# Patient Record
Sex: Female | Born: 1980 | Race: White | Hispanic: No | Marital: Married | Smoking: Never smoker
Health system: Southern US, Community
[De-identification: ages and names within clinical notes are randomized; demographics above are authoritative.]

## PROBLEM LIST (undated history)

## (undated) DIAGNOSIS — E282 Polycystic ovarian syndrome: Secondary | ICD-10-CM

## (undated) DIAGNOSIS — E079 Disorder of thyroid, unspecified: Secondary | ICD-10-CM

## (undated) DIAGNOSIS — E559 Vitamin D deficiency, unspecified: Secondary | ICD-10-CM

## (undated) DIAGNOSIS — F32A Depression, unspecified: Secondary | ICD-10-CM

## (undated) DIAGNOSIS — K219 Gastro-esophageal reflux disease without esophagitis: Secondary | ICD-10-CM

## (undated) DIAGNOSIS — D649 Anemia, unspecified: Secondary | ICD-10-CM

## (undated) HISTORY — PX: WISDOM TOOTH EXTRACTION: SHX21

---

## 2006-12-09 ENCOUNTER — Ambulatory Visit: Payer: Self-pay | Admitting: Internal Medicine

## 2006-12-23 ENCOUNTER — Ambulatory Visit: Payer: Self-pay | Admitting: Internal Medicine

## 2006-12-27 ENCOUNTER — Ambulatory Visit: Payer: Self-pay | Admitting: Internal Medicine

## 2007-01-27 ENCOUNTER — Ambulatory Visit: Payer: Self-pay | Admitting: Internal Medicine

## 2007-02-24 ENCOUNTER — Ambulatory Visit: Payer: Self-pay | Admitting: Internal Medicine

## 2007-05-05 ENCOUNTER — Ambulatory Visit: Payer: Self-pay | Admitting: Internal Medicine

## 2007-08-04 ENCOUNTER — Ambulatory Visit: Payer: Self-pay | Admitting: Internal Medicine

## 2007-09-01 ENCOUNTER — Ambulatory Visit: Payer: Self-pay | Admitting: Gastroenterology

## 2013-06-23 DIAGNOSIS — E039 Hypothyroidism, unspecified: Secondary | ICD-10-CM

## 2013-06-23 DIAGNOSIS — R5381 Other malaise: Secondary | ICD-10-CM | POA: Insufficient documentation

## 2013-06-23 DIAGNOSIS — E785 Hyperlipidemia, unspecified: Secondary | ICD-10-CM | POA: Insufficient documentation

## 2013-06-25 DIAGNOSIS — F329 Major depressive disorder, single episode, unspecified: Secondary | ICD-10-CM | POA: Insufficient documentation

## 2013-06-25 DIAGNOSIS — R7309 Other abnormal glucose: Secondary | ICD-10-CM | POA: Insufficient documentation

## 2014-04-17 DIAGNOSIS — R7989 Other specified abnormal findings of blood chemistry: Secondary | ICD-10-CM | POA: Insufficient documentation

## 2014-05-07 DIAGNOSIS — D509 Iron deficiency anemia, unspecified: Secondary | ICD-10-CM | POA: Insufficient documentation

## 2016-11-17 DIAGNOSIS — N3281 Overactive bladder: Secondary | ICD-10-CM | POA: Insufficient documentation

## 2017-08-30 DIAGNOSIS — H9201 Otalgia, right ear: Secondary | ICD-10-CM | POA: Insufficient documentation

## 2017-08-30 DIAGNOSIS — K219 Gastro-esophageal reflux disease without esophagitis: Secondary | ICD-10-CM

## 2017-08-30 DIAGNOSIS — R491 Aphonia: Secondary | ICD-10-CM | POA: Insufficient documentation

## 2018-05-26 DIAGNOSIS — R0683 Snoring: Secondary | ICD-10-CM | POA: Insufficient documentation

## 2020-03-28 ENCOUNTER — Other Ambulatory Visit: Payer: Self-pay

## 2020-03-28 ENCOUNTER — Encounter (HOSPITAL_BASED_OUTPATIENT_CLINIC_OR_DEPARTMENT_OTHER): Payer: Self-pay

## 2020-03-28 ENCOUNTER — Observation Stay (HOSPITAL_BASED_OUTPATIENT_CLINIC_OR_DEPARTMENT_OTHER)
Admission: EM | Admit: 2020-03-28 | Discharge: 2020-03-31 | Disposition: A | Discharge status: Home / Self Care | Payer: 59 | Attending: Internal Medicine | Admitting: Internal Medicine

## 2020-03-28 ENCOUNTER — Emergency Department (HOSPITAL_BASED_OUTPATIENT_CLINIC_OR_DEPARTMENT_OTHER): Payer: 59

## 2020-03-28 DIAGNOSIS — R7401 Elevation of levels of liver transaminase levels: Secondary | ICD-10-CM | POA: Insufficient documentation

## 2020-03-28 DIAGNOSIS — Z79899 Other long term (current) drug therapy: Secondary | ICD-10-CM | POA: Diagnosis not present

## 2020-03-28 DIAGNOSIS — E039 Hypothyroidism, unspecified: Secondary | ICD-10-CM

## 2020-03-28 DIAGNOSIS — K7581 Nonalcoholic steatohepatitis (NASH): Secondary | ICD-10-CM

## 2020-03-28 DIAGNOSIS — K219 Gastro-esophageal reflux disease without esophagitis: Secondary | ICD-10-CM

## 2020-03-28 DIAGNOSIS — Z20822 Contact with and (suspected) exposure to covid-19: Secondary | ICD-10-CM | POA: Diagnosis not present

## 2020-03-28 DIAGNOSIS — K8071 Calculus of gallbladder and bile duct without cholecystitis with obstruction: Secondary | ICD-10-CM | POA: Diagnosis not present

## 2020-03-28 DIAGNOSIS — F32A Depression, unspecified: Secondary | ICD-10-CM

## 2020-03-28 DIAGNOSIS — R1031 Right lower quadrant pain: Secondary | ICD-10-CM | POA: Diagnosis present

## 2020-03-28 DIAGNOSIS — K8000 Calculus of gallbladder with acute cholecystitis without obstruction: Secondary | ICD-10-CM

## 2020-03-28 DIAGNOSIS — K8020 Calculus of gallbladder without cholecystitis without obstruction: Secondary | ICD-10-CM

## 2020-03-28 DIAGNOSIS — R933 Abnormal findings on diagnostic imaging of other parts of digestive tract: Secondary | ICD-10-CM

## 2020-03-28 DIAGNOSIS — K831 Obstruction of bile duct: Secondary | ICD-10-CM | POA: Diagnosis present

## 2020-03-28 DIAGNOSIS — R7989 Other specified abnormal findings of blood chemistry: Secondary | ICD-10-CM

## 2020-03-28 DIAGNOSIS — R1011 Right upper quadrant pain: Secondary | ICD-10-CM

## 2020-03-28 HISTORY — DX: Depression, unspecified: F32.A

## 2020-03-28 HISTORY — DX: Vitamin D deficiency, unspecified: E55.9

## 2020-03-28 HISTORY — DX: Anemia, unspecified: D64.9

## 2020-03-28 HISTORY — DX: Disorder of thyroid, unspecified: E07.9

## 2020-03-28 HISTORY — DX: Polycystic ovarian syndrome: E28.2

## 2020-03-28 HISTORY — DX: Gastro-esophageal reflux disease without esophagitis: K21.9

## 2020-03-28 LAB — COMPREHENSIVE METABOLIC PANEL
ALT: 149 U/L — ABNORMAL HIGH (ref 0–44)
AST: 176 U/L — ABNORMAL HIGH (ref 15–41)
Albumin: 3.8 g/dL (ref 3.5–5.0)
Alkaline Phosphatase: 175 U/L — ABNORMAL HIGH (ref 38–126)
Anion gap: 10 (ref 5–15)
BUN: 8 mg/dL (ref 6–20)
CO2: 23 mmol/L (ref 22–32)
Calcium: 8.6 mg/dL — ABNORMAL LOW (ref 8.9–10.3)
Chloride: 103 mmol/L (ref 98–111)
Creatinine, Ser: 0.68 mg/dL (ref 0.44–1.00)
GFR, Estimated: >60 mL/min (ref >60)
Glucose, Bld: 97 mg/dL (ref 70–99)
Potassium: 3.7 mmol/L (ref 3.5–5.1)
Sodium: 136 mmol/L (ref 135–145)
Total Bilirubin: 3 mg/dL — ABNORMAL HIGH (ref 0.3–1.2)
Total Protein: 7.2 g/dL (ref 6.5–8.1)

## 2020-03-28 LAB — URINALYSIS, ROUTINE W REFLEX MICROSCOPIC
Glucose, UA: NEGATIVE mg/dL
Hgb urine dipstick: NEGATIVE
Ketones, ur: NEGATIVE mg/dL
Leukocytes,Ua: NEGATIVE
Nitrite: NEGATIVE
Protein, ur: NEGATIVE mg/dL
Specific Gravity, Urine: 1.01 (ref 1.005–1.030)
pH: 7.5 (ref 5.0–8.0)

## 2020-03-28 LAB — LIPASE, BLOOD: Lipase: 23 U/L (ref 11–51)

## 2020-03-28 LAB — CBC
HCT: 41.7 % (ref 36.0–46.0)
Hemoglobin: 13.5 g/dL (ref 12.0–15.0)
MCH: 25.7 pg — ABNORMAL LOW (ref 26.0–34.0)
MCHC: 32.4 g/dL (ref 30.0–36.0)
MCV: 79.4 fL — ABNORMAL LOW (ref 80.0–100.0)
Platelets: 205 10*3/uL (ref 150–400)
RBC: 5.25 MIL/uL — ABNORMAL HIGH (ref 3.87–5.11)
RDW: 14 % (ref 11.5–15.5)
WBC: 7.5 10*3/uL (ref 4.0–10.5)
nRBC: 0 % (ref 0.0–0.2)

## 2020-03-28 LAB — RESP PANEL BY RT-PCR (FLU A&B, COVID) ARPGX2
Influenza A by PCR: NEGATIVE
Influenza B by PCR: NEGATIVE
SARS Coronavirus 2 by RT PCR: NEGATIVE

## 2020-03-28 LAB — PREGNANCY, URINE: Preg Test, Ur: NEGATIVE

## 2020-03-28 MED ORDER — MORPHINE SULFATE (PF) 4 MG/ML IV SOLN
4.0000 mg | Freq: Once | INTRAVENOUS | Status: AC
  Administered 2020-03-28: 4 mg via INTRAVENOUS
  Filled 2020-03-28: qty 1

## 2020-03-28 MED ORDER — ONDANSETRON HCL 4 MG/2ML IJ SOLN
4.0000 mg | Freq: Once | INTRAMUSCULAR | Status: AC
  Administered 2020-03-28: 4 mg via INTRAVENOUS
  Filled 2020-03-28: qty 2

## 2020-03-28 NOTE — Plan of Care (Signed)
Patient accepted for biliary obstruction. ED spoke with GI who requested MRCP. They plan on seeing her in the morning.

## 2020-03-28 NOTE — ED Triage Notes (Addendum)
Pt c/o intermittent right side abd pain x 2 days-states she was sent from UC for gallbladder evaluation-NAD-steady gait

## 2020-03-28 NOTE — ED Provider Notes (Signed)
[facility] EMERGENCY DEPARTMENT Provider Note   CSN: [id number] Arrival date & time: 03/28/20  1507     History Chief Complaint  Patient presents with  . Abdominal Pain    Brittany Esparza is a 39 y.o. female presented for evaluation of nausea and abdominal pain.  Patient states for the past several days, she has had right upper quadrant abdominal pain with associated nausea.  Symptoms worsened yesterday.  She denies associated fevers.  No vomiting.  Pain is constant, nothing makes it better or worse.  She denies cough, chest pain, shortness of breath, abnormal bowel movements.  Patient states today she felt her urine was darker than normal, prompting her visit to the urgent care due to concerns for UTI.  She had a normal lab work, sent to the ER for further evaluation.  She denies previous history of abdominal problems or surgeries. Never seen a GI doc before.   Additional history obtained from chart review.  Patient with a history of anemia, depression, GERD, PCOS, hypothyroidism.  HPI     Past Medical History:  Diagnosis Date  . Anemia   . Depression   . GERD (gastroesophageal reflux disease)   . PCOS (polycystic ovarian syndrome)   . Thyroid disease   . Vitamin D deficiency     Patient Active Problem List   Diagnosis Date Noted  . Biliary obstruction 03/28/2020    Past Surgical History:  Procedure Laterality Date  . CESAREAN SECTION    . WISDOM TOOTH EXTRACTION       OB History   No obstetric history on file.     No family history on file.  Social History   Tobacco Use  . Smoking status: Never Smoker  . Smokeless tobacco: Never Used  Substance Use Topics  . Alcohol use: Yes    Comment: rare  . Drug use: Never    Home Medications Prior to Admission medications   Medication Sig Start Date End Date Taking? Authorizing Provider  levothyroxine (SYNTHROID) 112 MCG tablet Take 224 mcg by mouth daily before breakfast.    [provider]  pantoprazole (PROTONIX) 40 MG tablet Take 40 mg by mouth daily.    [provider]  sertraline (ZOLOFT) 50 MG tablet Take 150 mg by mouth daily.    [provider]  Vitamin D, Ergocalciferol, (DRISDOL) 1.25 MG (50000 UNIT) CAPS capsule Take 50,000 Units by mouth every 7 (seven) days.    [provider]    Allergies    Penicillins  Review of Systems   Review of Systems  Gastrointestinal: Positive for abdominal pain and nausea.  All other systems reviewed and are negative.   Physical Exam Updated Vital Signs BP 119/69 (BP Location: Left Arm)   Pulse 79   Temp 98.2 F (36.8 C) (Oral)   Resp 20   Ht 5\' 5"  (1.651 m)   Wt (!) 159.7 kg   LMP 03/04/2020   SpO2 98%   BMI 58.58 kg/m   Physical Exam Vitals and nursing note reviewed.  Constitutional:      General: She is not in acute distress.    Appearance: She is well-developed and well-nourished. She is obese.     Comments: Resting in the bed in no acute distress  HENT:     Head: Normocephalic and atraumatic.  Eyes:     Extraocular Movements: EOM normal.     Conjunctiva/sclera: Conjunctivae normal.     Pupils: Pupils are equal, round, and reactive  to light.  Cardiovascular:     Rate and Rhythm: Normal rate and regular rhythm.     Pulses: Normal pulses and intact distal pulses.  Pulmonary:     Effort: Pulmonary effort is normal. No respiratory distress.     Breath sounds: Normal breath sounds. No wheezing.  Abdominal:     General: Bowel sounds are normal. There is no distension.     Palpations: Abdomen is soft.     Tenderness: There is abdominal tenderness. There is no guarding or rebound.     Comments: Tenderness palpation of right upper quadrant abdomen.  No rigidity, guarding, distention.  Negative rebound.  Exam is limited due to body habitus.  Musculoskeletal:        General: Normal range of motion.     Cervical back: Normal range of motion and neck supple.  Skin:     General: Skin is warm and dry.     Capillary Refill: Capillary refill takes less than 2 seconds.  Neurological:     Mental Status: She is alert and oriented to person, place, and time.  Psychiatric:        Mood and Affect: Mood and affect normal.     ED Results / Procedures / Treatments   Labs (all labs ordered are listed, but only abnormal results are displayed) Labs Reviewed  COMPREHENSIVE METABOLIC PANEL - Abnormal; Notable for the following components:      Result Value   Calcium 8.6 (*)    AST 176 (*)    ALT 149 (*)    Alkaline Phosphatase 175 (*)    Total Bilirubin 3.0 (*)    All other components within normal limits  CBC - Abnormal; Notable for the following components:   RBC 5.25 (*)    MCV 79.4 (*)    MCH 25.7 (*)    All other components within normal limits  URINALYSIS, ROUTINE W REFLEX MICROSCOPIC - Abnormal; Notable for the following components:   APPearance HAZY (*)    Bilirubin Urine SMALL (*)    All other components within normal limits  RESP PANEL BY RT-PCR (FLU A&B, COVID) ARPGX2  LIPASE, BLOOD  PREGNANCY, URINE    EKG None  Radiology US Abdomen Limited RUQ (LIVER/GB)  Result Date: 03/28/2020 CLINICAL DATA:  Right upper quadrant pain. EXAM: ULTRASOUND ABDOMEN LIMITED RIGHT UPPER QUADRANT COMPARISON:  None. FINDINGS: Gallbladder: Partially distended. Multiple intraluminal gallstones. No gallbladder wall thickening or pericholecystic fluid. No sonographic Murphy sign noted by sonographer. Common bile duct: Diameter: 9 mm, dilated.  No visualized choledocholithiasis. Liver: Heterogeneously increased hepatic echogenicity. The liver parenchyma is difficult to penetrate. No evidence of focal lesion on this limited exam. Portal vein is patent on color Doppler imaging with normal direction of blood flow towards the liver. Other: No right upper quadrant ascites. IMPRESSION: 1. Gallstones without sonographic findings of acute cholecystitis. 2. Dilated common bile duct  9 mm. No visualized choledocholithiasis. Consider further evaluation with MRCP. MRCP should only be considered if patient is able to tolerate breath hold technique. 3. Hepatic steatosis. Electronically Signed   By: Narda Rutherford M.D.   On: 03/28/2020 16:10    Procedures Procedures (including critical care time)  Medications Ordered in ED Medications  ondansetron (ZOFRAN) injection 4 mg (4 mg Intravenous Given 03/28/20 1742)  morphine 4 MG/ML injection 4 mg (4 mg Intravenous Given 03/28/20 1744)    ED Course  I have reviewed the triage vital signs and the nursing notes.  Pertinent labs & imaging results  that were available during my care of the patient were reviewed by me and considered in my medical decision making (see chart for details).    MDM Rules/Calculators/A&P                          Patient presented for evaluation of right upper quadrant abdominal pain and nausea.  On exam, patient appears nontoxic.  She does have tenderness of the right upper quadrant.  Labs obtained from triage interpreted by me, shows elevated LFTs and bili.  No leukocytosis.  Concern for gallbladder/liver etiology.  Ultrasound pending.  Ultrasound shows gallstones without signs of cholecystitis.  However there is concern for possible stone in the duct.  As patient does have elevated LFTs and bili with continued pain, clinically she may have stones in the duct.  Will need to consult with GI, she will likely need to be admitted for MRCP.  Discussed with Dr. Dulce Sellar from GI who agrees on admission to medicine for MRCP. Will see pt in consult after imaging.   Discussed with Dr. Piedad Climes from [facility], pt to be admitted.   Final Clinical Impression(s) / ED Diagnoses Final diagnoses:  RUQ abdominal pain  Calculus of gallbladder and bile duct with obstruction without cholecystitis  Elevated LFTs    Rx / DC Orders ED Discharge Orders    None       Alveria Apley, PA-C 03/28/20  1841    Milagros Loll, MD 03/30/20 1219

## 2020-03-29 ENCOUNTER — Observation Stay (HOSPITAL_COMMUNITY): Payer: 59

## 2020-03-29 DIAGNOSIS — K831 Obstruction of bile duct: Secondary | ICD-10-CM

## 2020-03-29 DIAGNOSIS — F32A Depression, unspecified: Secondary | ICD-10-CM

## 2020-03-29 LAB — COMPREHENSIVE METABOLIC PANEL
ALT: 116 U/L — ABNORMAL HIGH (ref 0–44)
AST: 107 U/L — ABNORMAL HIGH (ref 15–41)
Albumin: 3.3 g/dL — ABNORMAL LOW (ref 3.5–5.0)
Alkaline Phosphatase: 152 U/L — ABNORMAL HIGH (ref 38–126)
Anion gap: 10 (ref 5–15)
BUN: 8 mg/dL (ref 6–20)
CO2: 25 mmol/L (ref 22–32)
Calcium: 8.4 mg/dL — ABNORMAL LOW (ref 8.9–10.3)
Chloride: 103 mmol/L (ref 98–111)
Creatinine, Ser: 0.77 mg/dL (ref 0.44–1.00)
GFR, Estimated: >60 mL/min (ref >60)
Glucose, Bld: 103 mg/dL — ABNORMAL HIGH (ref 70–99)
Potassium: 3.6 mmol/L (ref 3.5–5.1)
Sodium: 138 mmol/L (ref 135–145)
Total Bilirubin: 1.4 mg/dL — ABNORMAL HIGH (ref 0.3–1.2)
Total Protein: 6.3 g/dL — ABNORMAL LOW (ref 6.5–8.1)

## 2020-03-29 LAB — HIV ANTIBODY (ROUTINE TESTING W REFLEX): HIV Screen 4th Generation wRfx: NONREACTIVE

## 2020-03-29 MED ORDER — LEVOTHYROXINE SODIUM 112 MCG PO TABS
224.0000 ug | ORAL_TABLET | Freq: Every day | ORAL | Status: DC
  Administered 2020-03-29 – 2020-03-31 (×3): 224 ug via ORAL
  Filled 2020-03-29 (×3): qty 2

## 2020-03-29 MED ORDER — BISACODYL 5 MG PO TBEC: 5.0000 mg | DELAYED_RELEASE_TABLET | Freq: Every day | ORAL | Status: DC | PRN

## 2020-03-29 MED ORDER — PANTOPRAZOLE SODIUM 40 MG PO TBEC
40.0000 mg | DELAYED_RELEASE_TABLET | Freq: Every day | ORAL | Status: DC
  Administered 2020-03-29 – 2020-03-31 (×3): 40 mg via ORAL
  Filled 2020-03-29 (×3): qty 1

## 2020-03-29 MED ORDER — GADOBUTROL 1 MMOL/ML IV SOLN
10.0000 mL | Freq: Once | INTRAVENOUS | Status: AC | PRN
  Administered 2020-03-29: 10 mL via INTRAVENOUS

## 2020-03-29 MED ORDER — ENOXAPARIN SODIUM 80 MG/0.8ML ~~LOC~~ SOLN
80.0000 mg | SUBCUTANEOUS | Status: DC
  Administered 2020-03-29: 80 mg via SUBCUTANEOUS
  Filled 2020-03-29: qty 0.8

## 2020-03-29 MED ORDER — KETOROLAC TROMETHAMINE 30 MG/ML IJ SOLN
30.0000 mg | Freq: Once | INTRAMUSCULAR | Status: AC
  Administered 2020-03-29: 30 mg via INTRAVENOUS
  Filled 2020-03-29: qty 1

## 2020-03-29 MED ORDER — OXYCODONE HCL 5 MG PO TABS
5.0000 mg | ORAL_TABLET | ORAL | Status: DC | PRN
  Administered 2020-03-30 – 2020-03-31 (×5): 5 mg via ORAL
  Filled 2020-03-29 (×6): qty 1

## 2020-03-29 MED ORDER — ADULT MULTIVITAMIN W/MINERALS CH
1.0000 | ORAL_TABLET | Freq: Every day | ORAL | Status: DC
  Administered 2020-03-29 – 2020-03-31 (×2): 1 via ORAL
  Filled 2020-03-29 (×2): qty 1

## 2020-03-29 MED ORDER — ENOXAPARIN SODIUM 40 MG/0.4ML ~~LOC~~ SOLN: 40.0000 mg | SUBCUTANEOUS | Status: DC

## 2020-03-29 MED ORDER — SERTRALINE HCL 50 MG PO TABS
150.0000 mg | ORAL_TABLET | Freq: Every day | ORAL | Status: DC
  Administered 2020-03-29 – 2020-03-31 (×3): 150 mg via ORAL
  Filled 2020-03-29 (×3): qty 1

## 2020-03-29 MED ORDER — ONDANSETRON HCL 4 MG/2ML IJ SOLN
4.0000 mg | Freq: Once | INTRAMUSCULAR | Status: AC
  Administered 2020-03-29: 4 mg via INTRAVENOUS
  Filled 2020-03-29: qty 2

## 2020-03-29 MED ORDER — SENNOSIDES-DOCUSATE SODIUM 8.6-50 MG PO TABS: 1.0000 | ORAL_TABLET | Freq: Every evening | ORAL | Status: DC | PRN

## 2020-03-29 NOTE — H&P (Signed)
History and Physical    Brittany Esparza UMP:[id number] DOB: 1980/05/29 DOA: 03/28/2020  Referring MD/NP/PA:  PCP: Beryle Quant, MD  Outpatient Specialists:  Patient coming from: Home  Chief Complaint: abdominal pain  HPI: Brittany Esparza is a 39 y.o. female with medical history significant of obesity, hypothyroidism, GERD, depression who presents with biliary obstruction as a transfer from urgent care.  Patient reports having abdominal pain this past Saturday that was evanescent. She had a recurrence of Wednesday that was not associated with food but described as an epigastric abdominal pain that radiated to her back and was associated with nausea and bloating.  She had noted that her urine turned dark at that point she decided to be evaluated in urgent care.  She denies fevers, chills, vomiting, chest pain, shortness of breath, headache, change in vision,diarrhea.   In the urgent care she was noted to have a normal WBC with a CMP that demonstrated alk phos 175 AST ALT 176, 149 respectively and elevated T bilirubin of 3.0.  UA showed small bilirubin.  Ultrasound was obtained which showed gallstones without sonographic findings of acute cholecystitis. Dilated common bile duct 9 mm and hepatic steatosis.  GI was consulted with plans for MRCP in the morning. She was given morphine and she was transferred here for further evaluation management.  Review of Systems: As per HPI otherwise 10 point review of systems negative.   Past Medical History:  Diagnosis Date  . Anemia   . Depression   . GERD (gastroesophageal reflux disease)   . PCOS (polycystic ovarian syndrome)   . Thyroid disease   . Vitamin D deficiency     Past Surgical History:  Procedure Laterality Date  . CESAREAN SECTION    . WISDOM TOOTH EXTRACTION       reports that she has never smoked. She has never used smokeless tobacco. She reports current alcohol use. She reports that she does not use  drugs.  Allergies  Allergen Reactions  . Penicillins Dermatitis    No family history on file. Unacceptable: Noncontributory, unremarkable, or negative. Acceptable: Family history reviewed and not pertinent (If you reviewed it)  Prior to Admission medications   Medication Sig Start Date End Date Taking? Authorizing Provider  levothyroxine (SYNTHROID) 112 MCG tablet Take 224 mcg by mouth daily before breakfast.   Yes [provider]  pantoprazole (PROTONIX) 40 MG tablet Take 40 mg by mouth daily.   Yes [provider]  sertraline (ZOLOFT) 50 MG tablet Take 150 mg by mouth daily.   Yes [provider]    Physical Exam: Vitals:   03/28/20 2130 03/28/20 2134 03/28/20 2220 03/29/20 0153  BP: 105/63 105/63 (!) 103/58 (!) 91/53  Pulse: 99 99 71 79  Resp: '18 18 18 18  ' Temp:  98.2 F (36.8 C) 98.5 F (36.9 C) 97.8 F (36.6 C)  TempSrc:   Oral Oral  SpO2: 100% 100% 98% 96%  Weight:      Height:          Constitutional: NAD, calm, comfortable Vitals:   03/28/20 2130 03/28/20 2134 03/28/20 2220 03/29/20 0153  BP: 105/63 105/63 (!) 103/58 (!) 91/53  Pulse: 99 99 71 79  Resp: '18 18 18 18  ' Temp:  98.2 F (36.8 C) 98.5 F (36.9 C) 97.8 F (36.6 C)  TempSrc:   Oral Oral  SpO2: 100% 100% 98% 96%  Weight:      Height:       Eyes: PERRL, lids  and conjunctivae normal ENMT: Mucous membranes are moist.  Neck: normal, supple Respiratory: clear to auscultation bilaterally, no wheezing, no crackles. Normal respiratory effort. No accessory muscle use.  Cardiovascular: Regular rate and rhythm, no murmurs / rubs / gallops. No extremity edema. 2+ pedal pulses. No carotid bruits.  Abdomen: no tenderness, no masses palpated. Bowel sounds positive.  Skin: no rashes, lesions, ulcers. No induration Neurologic: CN 2-12 grossly intact. Moving all extremities  Psychiatric: Normal judgment and insight. Alert and oriented x 3. Normal mood.   Labs on Admission: I have  personally reviewed following labs and imaging studies  CBC: Recent Labs  Lab 03/28/20 1532  WBC 7.5  HGB 13.5  HCT 41.7  MCV 79.4*  PLT 712   Basic Metabolic Panel: Recent Labs  Lab 03/28/20 1532  NA 136  K 3.7  CL 103  CO2 23  GLUCOSE 97  BUN 8  CREATININE 0.68  CALCIUM 8.6*   GFR: Estimated Creatinine Clearance: 149.1 mL/min (by C-G formula based on SCr of 0.68 mg/dL). Liver Function Tests: Recent Labs  Lab 03/28/20 1532  AST 176*  ALT 149*  ALKPHOS 175*  BILITOT 3.0*  PROT 7.2  ALBUMIN 3.8   Recent Labs  Lab 03/28/20 1532  LIPASE 23   No results for input(s): AMMONIA in the last 168 hours. Coagulation Profile: No results for input(s): INR, PROTIME in the last 168 hours. Cardiac Enzymes: No results for input(s): CKTOTAL, CKMB, CKMBINDEX, TROPONINI in the last 168 hours. BNP (last 3 results) No results for input(s): PROBNP in the last 8760 hours. HbA1C: No results for input(s): HGBA1C in the last 72 hours. CBG: No results for input(s): GLUCAP in the last 168 hours. Lipid Profile: No results for input(s): CHOL, HDL, LDLCALC, TRIG, CHOLHDL, LDLDIRECT in the last 72 hours. Thyroid Function Tests: No results for input(s): TSH, T4TOTAL, FREET4, T3FREE, THYROIDAB in the last 72 hours. Anemia Panel: No results for input(s): VITAMINB12, FOLATE, FERRITIN, TIBC, IRON, RETICCTPCT in the last 72 hours. Urine analysis:    Component Value Date/Time   COLORURINE YELLOW 03/28/2020 1532   APPEARANCEUR HAZY (A) 03/28/2020 1532   LABSPEC 1.010 03/28/2020 1532   PHURINE 7.5 03/28/2020 1532   GLUCOSEU NEGATIVE 03/28/2020 1532   HGBUR NEGATIVE 03/28/2020 1532   BILIRUBINUR SMALL (A) 03/28/2020 1532   KETONESUR NEGATIVE 03/28/2020 1532   PROTEINUR NEGATIVE 03/28/2020 1532   NITRITE NEGATIVE 03/28/2020 1532   LEUKOCYTESUR NEGATIVE 03/28/2020 1532   Sepsis Labs: '@LABRCNTIP' (procalcitonin:4,lacticidven:4) ) Recent Results (from the past 240 hour(s))  Resp Panel  by RT-PCR (Flu A&B, Covid) Nasopharyngeal Swab     Status: None   Collection Time: 03/28/20  5:51 PM   Specimen: Nasopharyngeal Swab; Nasopharyngeal(NP) swabs in vial transport medium  Result Value Ref Range Status   SARS Coronavirus 2 by RT PCR NEGATIVE NEGATIVE Final    Comment: (NOTE) SARS-CoV-2 target nucleic acids are NOT DETECTED.  The SARS-CoV-2 RNA is generally detectable in upper respiratory specimens during the acute phase of infection. The lowest concentration of SARS-CoV-2 viral copies this assay can detect is 138 copies/mL. A negative result does not preclude SARS-Cov-2 infection and should not be used as the sole basis for treatment or other patient management decisions. A negative result may occur with  improper specimen collection/handling, submission of specimen other than nasopharyngeal swab, presence of viral mutation(s) within the areas targeted by this assay, and inadequate number of viral copies(<138 copies/mL). A negative result must be combined with clinical observations, patient history, and epidemiological  information. The expected result is Negative.  Fact Sheet for Patients:  EntrepreneurPulse.com.au  Fact Sheet for Healthcare Providers:  IncredibleEmployment.be  This test is no t yet approved or cleared by the [place] FDA and  has been authorized for detection and/or diagnosis of SARS-CoV-2 by FDA under an Emergency Use Authorization (EUA). This EUA will remain  in effect (meaning this test can be used) for the duration of the COVID-19 declaration under Section 564(b)(1) of the Act, 21 U.S.C.section 360bbb-3(b)(1), unless the authorization is terminated  or revoked sooner.       Influenza A by PCR NEGATIVE NEGATIVE Final   Influenza B by PCR NEGATIVE NEGATIVE Final    Comment: (NOTE) The Xpert Xpress SARS-CoV-2/FLU/RSV plus assay is intended as an aid in the diagnosis of influenza from Nasopharyngeal swab  specimens and should not be used as a sole basis for treatment. Nasal washings and aspirates are unacceptable for Xpert Xpress SARS-CoV-2/FLU/RSV testing.  Fact Sheet for Patients: EntrepreneurPulse.com.au  Fact Sheet for Healthcare Providers: IncredibleEmployment.be  This test is not yet approved or cleared by the [place] FDA and has been authorized for detection and/or diagnosis of SARS-CoV-2 by FDA under an Emergency Use Authorization (EUA). This EUA will remain in effect (meaning this test can be used) for the duration of the COVID-19 declaration under Section 564(b)(1) of the Act, 21 U.S.C. section 360bbb-3(b)(1), unless the authorization is terminated or revoked.  Performed at [facility], [address] [place], [place] [address]      Radiological Exams on Admission: US Abdomen Limited RUQ (LIVER/GB)  Result Date: 03/28/2020 CLINICAL DATA:  Right upper quadrant pain. EXAM: ULTRASOUND ABDOMEN LIMITED RIGHT UPPER QUADRANT COMPARISON:  None. FINDINGS: Gallbladder: Partially distended. Multiple intraluminal gallstones. No gallbladder wall thickening or pericholecystic fluid. No sonographic Murphy sign noted by sonographer. Common bile duct: Diameter: 9 mm, dilated.  No visualized choledocholithiasis. Liver: Heterogeneously increased hepatic echogenicity. The liver parenchyma is difficult to penetrate. No evidence of focal lesion on this limited exam. Portal vein is patent on color Doppler imaging with normal direction of blood flow towards the liver. Other: No right upper quadrant ascites. IMPRESSION: 1. Gallstones without sonographic findings of acute cholecystitis. 2. Dilated common bile duct 9 mm. No visualized choledocholithiasis. Consider further evaluation with MRCP. MRCP should only be considered if patient is able to tolerate breath hold technique. 3. Hepatic steatosis. Electronically Signed   By: Keith Rake M.D.   On:  03/28/2020 16:10    Assessment/Plan Active Problems:   Biliary obstruction   Obesity   Depression   Hypothyroidism   GERD (gastroesophageal reflux disease)  Biliary obstruction Cholelithiasis Dilated common bile duct with cholelithiasis but no obvious choledocholithiasis with elevated T bili, AST, ALT, alk phos and UA positive for bilirubin.  She does have gallstones in her gallbladder and probably has passed one. GI is consulted and plans for MRCP. No evidence of cholangitis. Pregnancy neg. PLAN  - NPO - MRCP - F/u GI recs - analgesics  - trend cmp  Hypothyroidism Home synthroid  Depression  Home sertraline  GERD  Home Protonix   DVT prophylaxis: lovenox  Code Status: Full Code  Disposition Plan: Home likely 1-2 days Consults called: GI  Admission status: Observation   Severity of Illness: The appropriate patient status for this patient is OBSERVATION. Observation status is judged to be reasonable and necessary in order to provide the required intensity of service to ensure the patient's safety. The patient's presenting symptoms, physical  exam findings, and initial radiographic and laboratory data in the context of their medical condition is felt to place them at decreased risk for further clinical deterioration. Furthermore, it is anticipated that the patient will be medically stable for discharge from the hospital within 2 midnights of admission. The following factors support the patient status of observation.   " The patient's presenting symptoms include abdominal pain. " The physical exam findings include benign abdominal exam. " The initial radiographic and laboratory data are mixed hepatobiliary injury with biliary ductal dilation.     Pearletha Forge Amelio Brosky MD [facility] Pager [phone]  If 7PM-7AM, please contact night-coverage [email or web address] Password [email or web address]  03/29/2020, 2:18 AM

## 2020-03-29 NOTE — Progress Notes (Signed)
PROGRESS NOTE    Brittany Esparza  YPP:[id number] DOB: 03-Jul-1980 DOA: 03/28/2020 PCP: Alfonso Patten, MD    Brief Narrative:  39 y.o. female with medical history significant of obesity, hypothyroidism, GERD, depression who presents with biliary obstruction as a transfer from urgent care. Found to have cholelithiasis on imaging. Initial abd [place] with dilated CBD of 10mm with subsequent MRCP being neg for obstructing stone  Assessment & Plan:   Active Problems:   Biliary obstruction   Obesity   Depression   Hypothyroidism   GERD (gastroesophageal reflux disease)  Biliary obstruction Cholelithiasis Initial concerns of dilated common bile duct with cholelithiasis on abd [place] F/u MRCP without obstructing stone noted, suspicion for passed stone GI consulted who recommended surgical consult Discussed with General Surgery. Plan for surgery tomorrow AM NPO after midnight Follow up CMP  Hypothyroidism Continue with synthroid as toleratd  Depression  Continued on home sertraline   GERD  On Protonix prior to admit  DVT prophylaxis: SCD's Code Status: Full Family Communication: Pt in room, family not at bedside  Status is: Observation  The patient remains OBS appropriate and will d/c before 2 midnights.  Dispo: The patient is from: Home              Anticipated d/c is to: Home              Anticipated d/c date is: 2 days              Patient currently is not medically stable to d/c.       Consultants:   GI  General Surgery  Procedures:     Antimicrobials: Anti-infectives (From admission, onward)   None       Subjective: Denies abd pain or nausea this AM  Objective: Vitals:   03/29/20 0153 03/29/20 0544 03/29/20 1021 03/29/20 1405  BP: (!) 91/53 111/69 (!) 116/52 105/74  Pulse: 79 72 74 78  Resp: 18 19 18 18   Temp: 97.8 F (36.6 C) 98.8 F (37.1 C) 98.3 F (36.8 C) 98 F (36.7 C)  TempSrc: Oral Oral Oral Oral  SpO2: 96% 97% 98%  98%  Weight:      Height:        Intake/Output Summary (Last 24 hours) at 03/29/2020 1742 Last data filed at 03/29/2020 0545 Gross per 24 hour  Intake --  Output 3 ml  Net -3 ml   Filed Weights   03/28/20 1520  Weight: (!) 159.7 kg    Examination:  General exam: Appears calm and comfortable  Respiratory system: Clear to auscultation. Respiratory effort normal. Cardiovascular system: S1 & S2 heard, Regular Gastrointestinal system: Abdomen is nondistended, soft and nontender. No organomegaly or masses felt. Normal bowel sounds heard. Central nervous system: Alert and oriented. No focal neurological deficits. Extremities: Symmetric 5 x 5 power. Skin: No rashes, lesions Psychiatry: Judgement and insight appear normal. Mood & affect appropriate.   Data Reviewed: I have personally reviewed following labs and imaging studies  CBC: Recent Labs  Lab 03/28/20 1532  WBC 7.5  HGB 13.5  HCT 41.7  MCV 79.4*  PLT 205   Basic Metabolic Panel: Recent Labs  Lab 03/28/20 1532 03/29/20 0419  NA 136 138  K 3.7 3.6  CL 103 103  CO2 23 25  GLUCOSE 97 103*  BUN 8 8  CREATININE 0.68 0.77  CALCIUM 8.6* 8.4*   GFR: Estimated Creatinine Clearance: 149.1 mL/min (by C-G formula based on SCr of 0.77 mg/dL). Liver Function  Tests: Recent Labs  Lab 03/28/20 1532 03/29/20 0419  AST 176* 107*  ALT 149* 116*  ALKPHOS 175* 152*  BILITOT 3.0* 1.4*  PROT 7.2 6.3*  ALBUMIN 3.8 3.3*   Recent Labs  Lab 03/28/20 1532  LIPASE 23   No results for input(s): AMMONIA in the last 168 hours. Coagulation Profile: No results for input(s): INR, PROTIME in the last 168 hours. Cardiac Enzymes: No results for input(s): CKTOTAL, CKMB, CKMBINDEX, TROPONINI in the last 168 hours. BNP (last 3 results) No results for input(s): PROBNP in the last 8760 hours. HbA1C: No results for input(s): HGBA1C in the last 72 hours. CBG: No results for input(s): GLUCAP in the last 168 hours. Lipid Profile: No  results for input(s): CHOL, HDL, LDLCALC, TRIG, CHOLHDL, LDLDIRECT in the last 72 hours. Thyroid Function Tests: No results for input(s): TSH, T4TOTAL, FREET4, T3FREE, THYROIDAB in the last 72 hours. Anemia Panel: No results for input(s): VITAMINB12, FOLATE, FERRITIN, TIBC, IRON, RETICCTPCT in the last 72 hours. Sepsis Labs: No results for input(s): PROCALCITON, LATICACIDVEN in the last 168 hours.  Recent Results (from the past 240 hour(s))  Resp Panel by RT-PCR (Flu A&B, Covid) Nasopharyngeal Swab     Status: None   Collection Time: 03/28/20  5:51 PM   Specimen: Nasopharyngeal Swab; Nasopharyngeal(NP) swabs in vial transport medium  Result Value Ref Range Status   SARS Coronavirus 2 by RT PCR NEGATIVE NEGATIVE Final    Comment: (NOTE) SARS-CoV-2 target nucleic acids are NOT DETECTED.  The SARS-CoV-2 RNA is generally detectable in upper respiratory specimens during the acute phase of infection. The lowest concentration of SARS-CoV-2 viral copies this assay can detect is 138 copies/mL. A negative result does not preclude SARS-Cov-2 infection and should not be used as the sole basis for treatment or other patient management decisions. A negative result may occur with  improper specimen collection/handling, submission of specimen other than nasopharyngeal swab, presence of viral mutation(s) within the areas targeted by this assay, and inadequate number of viral copies(<138 copies/mL). A negative result must be combined with clinical observations, patient history, and epidemiological information. The expected result is Negative.  Fact Sheet for Patients:  BloggerCourse.comhttps://www.fda.gov/media/152166/download  Fact Sheet for Healthcare Providers:  SeriousBroker.ithttps://www.fda.gov/media/152162/download  This test is no t yet approved or cleared by the [place] FDA and  has been authorized for detection and/or diagnosis of SARS-CoV-2 by FDA under an Emergency Use Authorization (EUA). This EUA will remain   in effect (meaning this test can be used) for the duration of the COVID-19 declaration under Section 564(b)(1) of the Act, 21 U.S.C.section 360bbb-3(b)(1), unless the authorization is terminated  or revoked sooner.       Influenza A by PCR NEGATIVE NEGATIVE Final   Influenza B by PCR NEGATIVE NEGATIVE Final    Comment: (NOTE) The Xpert Xpress SARS-CoV-2/FLU/RSV plus assay is intended as an aid in the diagnosis of influenza from Nasopharyngeal swab specimens and should not be used as a sole basis for treatment. Nasal washings and aspirates are unacceptable for Xpert Xpress SARS-CoV-2/FLU/RSV testing.  Fact Sheet for Patients: BloggerCourse.comhttps://www.fda.gov/media/152166/download  Fact Sheet for Healthcare Providers: SeriousBroker.ithttps://www.fda.gov/media/152162/download  This test is not yet approved or cleared by the [place] FDA and has been authorized for detection and/or diagnosis of SARS-CoV-2 by FDA under an Emergency Use Authorization (EUA). This EUA will remain in effect (meaning this test can be used) for the duration of the COVID-19 declaration under Section 564(b)(1) of the Act, 21 U.S.C. section 360bbb-3(b)(1), unless the authorization  is terminated or revoked.  Performed at [facility], [address], [place], [place] [address]      Radiology Studies: MR 3D Recon At Scanner  Result Date: 03/29/2020 CLINICAL DATA:  39 year old female with history of right upper quadrant abdominal pain. Possible common bile duct dilatation noted on recent ultrasound examination. EXAM: MRI ABDOMEN WITHOUT AND WITH CONTRAST (INCLUDING MRCP) TECHNIQUE: Multiplanar multisequence MR imaging of the abdomen was performed both before and after the administration of intravenous contrast. Heavily T2-weighted images of the biliary and pancreatic ducts were obtained, and three-dimensional MRCP images were rendered by post processing. CONTRAST:  48mL GADAVIST GADOBUTROL 1 MMOL/ML IV SOLN COMPARISON:   No prior abdominal MRI. Abdominal ultrasound 03/28/2020. FINDINGS: Lower chest: Unremarkable. Hepatobiliary: Liver is enlarged measuring up to 23.2 cm in craniocaudal span. Severe diffuse loss of signal intensity throughout the hepatic parenchyma on out of phase dual echo images, indicative of a background of severe hepatic steatosis. No suspicious cystic or solid hepatic lesions. No intra or extrahepatic biliary ductal dilatation noted on MRCP images. Common bile duct measures up to 5 mm in the porta hepatis. No filling defect within the common bile duct to suggest choledocholithiasis. Multiple filling defects are noted within the gallbladder lying dependently, compatible with small gallstones. Gallbladder is not distended. Gallbladder wall thickness is normal. No pericholecystic fluid or surrounding inflammatory changes. Pancreas: No pancreatic mass. No pancreatic ductal dilatation noted on MRCP images. No pancreatic or peripancreatic fluid collections or inflammatory changes. Spleen:  Unremarkable. Adrenals/Urinary Tract: Subcentimeter T1 hypointense, T2 hyperintense, nonenhancing lesion in the lower pole of the left kidney, compatible with a simple cyst. Right kidney and bilateral adrenal glands are normal in appearance. No hydroureteronephrosis in the visualized portions of the abdomen. Stomach/Bowel: Visualized portions are unremarkable. Vascular/Lymphatic: No aneurysm. No lymphadenopathy identified in the visualized abdominal vasculature noted in the visualized abdomen. Other: No significant volume of ascites noted in the visualized portions of the peritoneal cavity. Musculoskeletal: No aggressive appearing osseous lesions noted in the visualized portions of the skeleton. IMPRESSION: 1. Study is positive for cholelithiasis. However, there is no evidence of acute cholecystitis. Additionally, there is no evidence of choledocholithiasis or biliary tract obstruction. 2. Hepatomegaly with severe hepatic steatosis.  Electronically Signed   By: Trudie Reed M.D.   On: 03/29/2020 07:13   MR ABDOMEN MRCP W WO CONTAST  Result Date: 03/29/2020 CLINICAL DATA:  39 year old female with history of right upper quadrant abdominal pain. Possible common bile duct dilatation noted on recent ultrasound examination. EXAM: MRI ABDOMEN WITHOUT AND WITH CONTRAST (INCLUDING MRCP) TECHNIQUE: Multiplanar multisequence MR imaging of the abdomen was performed both before and after the administration of intravenous contrast. Heavily T2-weighted images of the biliary and pancreatic ducts were obtained, and three-dimensional MRCP images were rendered by post processing. CONTRAST:  4mL GADAVIST GADOBUTROL 1 MMOL/ML IV SOLN COMPARISON:  No prior abdominal MRI. Abdominal ultrasound 03/28/2020. FINDINGS: Lower chest: Unremarkable. Hepatobiliary: Liver is enlarged measuring up to 23.2 cm in craniocaudal span. Severe diffuse loss of signal intensity throughout the hepatic parenchyma on out of phase dual echo images, indicative of a background of severe hepatic steatosis. No suspicious cystic or solid hepatic lesions. No intra or extrahepatic biliary ductal dilatation noted on MRCP images. Common bile duct measures up to 5 mm in the porta hepatis. No filling defect within the common bile duct to suggest choledocholithiasis. Multiple filling defects are noted within the gallbladder lying dependently, compatible with small gallstones. Gallbladder is not distended.  Gallbladder wall thickness is normal. No pericholecystic fluid or surrounding inflammatory changes. Pancreas: No pancreatic mass. No pancreatic ductal dilatation noted on MRCP images. No pancreatic or peripancreatic fluid collections or inflammatory changes. Spleen:  Unremarkable. Adrenals/Urinary Tract: Subcentimeter T1 hypointense, T2 hyperintense, nonenhancing lesion in the lower pole of the left kidney, compatible with a simple cyst. Right kidney and bilateral adrenal glands are normal in  appearance. No hydroureteronephrosis in the visualized portions of the abdomen. Stomach/Bowel: Visualized portions are unremarkable. Vascular/Lymphatic: No aneurysm. No lymphadenopathy identified in the visualized abdominal vasculature noted in the visualized abdomen. Other: No significant volume of ascites noted in the visualized portions of the peritoneal cavity. Musculoskeletal: No aggressive appearing osseous lesions noted in the visualized portions of the skeleton. IMPRESSION: 1. Study is positive for cholelithiasis. However, there is no evidence of acute cholecystitis. Additionally, there is no evidence of choledocholithiasis or biliary tract obstruction. 2. Hepatomegaly with severe hepatic steatosis. Electronically Signed   By: Trudie Reed M.D.   On: 03/29/2020 07:13   US Abdomen Limited RUQ (LIVER/GB)  Result Date: 03/28/2020 CLINICAL DATA:  Right upper quadrant pain. EXAM: ULTRASOUND ABDOMEN LIMITED RIGHT UPPER QUADRANT COMPARISON:  None. FINDINGS: Gallbladder: Partially distended. Multiple intraluminal gallstones. No gallbladder wall thickening or pericholecystic fluid. No sonographic Murphy sign noted by sonographer. Common bile duct: Diameter: 9 mm, dilated.  No visualized choledocholithiasis. Liver: Heterogeneously increased hepatic echogenicity. The liver parenchyma is difficult to penetrate. No evidence of focal lesion on this limited exam. Portal vein is patent on color Doppler imaging with normal direction of blood flow towards the liver. Other: No right upper quadrant ascites. IMPRESSION: 1. Gallstones without sonographic findings of acute cholecystitis. 2. Dilated common bile duct 9 mm. No visualized choledocholithiasis. Consider further evaluation with MRCP. MRCP should only be considered if patient is able to tolerate breath hold technique. 3. Hepatic steatosis. Electronically Signed   By: Narda Rutherford M.D.   On: 03/28/2020 16:10    Scheduled Meds: . levothyroxine  224 mcg Oral  Q0600  . multivitamin with minerals  1 tablet Oral Daily  . pantoprazole  40 mg Oral Daily  . sertraline  150 mg Oral Daily   Continuous Infusions:   LOS: 1 day   Rickey Barbara, MD [facility] Pager On Amion  If 7PM-7AM, please contact night-coverage 03/29/2020, 5:42 PM

## 2020-03-29 NOTE — Consult Note (Signed)
Consult Note  Brittany Esparza 11-27-1980  [id number].    Requesting MD: Rhona Leavens Chief Complaint/Reason for Consult: Symptomatic cholelithiasis HPI:  Patient is a 39 year old female who presented to [facility] with abdominal pain, nausea, and vomiting. Pain started 2-3 days ago and worsened on day of presentation. Pain constant in RUQ and radiates around R flank. Reports nausea and darkened urine. Currently symptoms have resolved. Denies fever, chills, chest pain, SOB. PMH otherwise significant for PCOS, hypothyroidism and obesity. Past abdominal surgery includes cesarean section. Not on any blood thinning meds at home. Allergy to PCNs. Drinks alcohol rarely and quit smoking 9 years ago. Denies illicit drug use. Works at a [employer] but primarily can be at a desk if needed. Husband present at bedside.   ROS: Review of Systems  Constitutional: Negative for chills and fever.  Respiratory: Negative for shortness of breath and wheezing.   Cardiovascular: Negative for chest pain and palpitations.  Gastrointestinal: Positive for abdominal pain and nausea. Negative for blood in stool, constipation, diarrhea and vomiting.  Genitourinary: Negative for dysuria, frequency and urgency.       Orange urine  All other systems reviewed and are negative.   No family history on file.  Past Medical History:  Diagnosis Date  . Anemia   . Depression   . GERD (gastroesophageal reflux disease)   . PCOS (polycystic ovarian syndrome)   . Thyroid disease   . Vitamin D deficiency     Past Surgical History:  Procedure Laterality Date  . CESAREAN SECTION    . WISDOM TOOTH EXTRACTION      Social History:  reports that she has never smoked. She has never used smokeless tobacco. She reports current alcohol use. She reports that she does not use drugs.  Allergies:  Allergies  Allergen Reactions  . Penicillins Dermatitis    Medications Prior to Admission  Medication Sig Dispense Refill   . levothyroxine (SYNTHROID) 112 MCG tablet Take 224 mcg by mouth daily before breakfast.    . pantoprazole (PROTONIX) 40 MG tablet Take 40 mg by mouth daily.    . sertraline (ZOLOFT) 50 MG tablet Take 150 mg by mouth daily.      Blood pressure (!) 116/52, pulse 74, temperature 98.3 F (36.8 C), temperature source Oral, resp. rate 18, height 5\' 5"  (1.651 m), weight (!) 159.7 kg, last menstrual period 03/04/2020, SpO2 98 %. Physical Exam:  General: pleasant, WD, morbidly obese female who is laying in bed in NAD HEENT: head is normocephalic, atraumatic.  Sclera are anicteric.  PERRL.  Ears and nose without any masses or lesions.  Mouth is pink and moist Heart: regular, rate, and rhythm.  Normal s1,s2. No obvious murmurs, gallops, or rubs noted.  Palpable radial and pedal pulses bilaterally Lungs: CTAB, no wheezes, rhonchi, or rales noted.  Respiratory effort nonlabored Abd: soft, NT, ND, +BS, no masses, hernias, or organomegaly MS: all 4 extremities are symmetrical with no cyanosis, clubbing, or edema. Skin: warm and dry with no masses, lesions, or rashes Neuro: Cranial nerves 2-12 grossly intact, sensation is normal throughout Psych: A&Ox3 with an appropriate affect.   Results for orders placed or performed during the hospital encounter of 03/28/20 (from the past 48 hour(s))  Lipase, blood     Status: None   Collection Time: 03/28/20  3:32 PM  Result Value Ref Range   Lipase 23 11 - 51 U/L    Comment: Performed at [facility], [address] [place], [place] [address]  Comprehensive metabolic panel     Status: Abnormal   Collection Time: 03/28/20  3:32 PM  Result Value Ref Range   Sodium 136 135 - 145 mmol/L   Potassium 3.7 3.5 - 5.1 mmol/L   Chloride 103 98 - 111 mmol/L   CO2 23 22 - 32 mmol/L   Glucose, Bld 97 70 - 99 mg/dL    Comment: Glucose reference range applies only to samples taken after fasting for at least 8 hours.   BUN 8 6 - 20 mg/dL   Creatinine, Ser 6.04  0.44 - 1.00 mg/dL   Calcium 8.6 (L) 8.9 - 10.3 mg/dL   Total Protein 7.2 6.5 - 8.1 g/dL   Albumin 3.8 3.5 - 5.0 g/dL   AST 540 (H) 15 - 41 U/L   ALT 149 (H) 0 - 44 U/L   Alkaline Phosphatase 175 (H) 38 - 126 U/L   Total Bilirubin 3.0 (H) 0.3 - 1.2 mg/dL   GFR, Estimated >98 >11 mL/min    Comment: (NOTE) Calculated using the CKD-EPI Creatinine Equation (2021)    Anion gap 10 5 - 15    Comment: Performed at [facility], [address] [place], [place] [address]  CBC     Status: Abnormal   Collection Time: 03/28/20  3:32 PM  Result Value Ref Range   WBC 7.5 4.0 - 10.5 K/uL   RBC 5.25 (H) 3.87 - 5.11 MIL/uL   Hemoglobin 13.5 12.0 - 15.0 g/dL   HCT 29.5 62.1 - 30.8 %   MCV 79.4 (L) 80.0 - 100.0 fL   MCH 25.7 (L) 26.0 - 34.0 pg   MCHC 32.4 30.0 - 36.0 g/dL   RDW 65.7 84.6 - 96.2 %   Platelets 205 150 - 400 K/uL   nRBC 0.0 0.0 - 0.2 %    Comment: Performed at [facility], [address] [place], [place] [address]  Urinalysis, Routine w reflex microscopic Urine, Clean Catch     Status: Abnormal   Collection Time: 03/28/20  3:32 PM  Result Value Ref Range   Color, Urine YELLOW YELLOW   APPearance HAZY (A) CLEAR   Specific Gravity, Urine 1.010 1.005 - 1.030   pH 7.5 5.0 - 8.0   Glucose, UA NEGATIVE NEGATIVE mg/dL   Hgb urine dipstick NEGATIVE NEGATIVE   Bilirubin Urine SMALL (A) NEGATIVE   Ketones, ur NEGATIVE NEGATIVE mg/dL   Protein, ur NEGATIVE NEGATIVE mg/dL   Nitrite NEGATIVE NEGATIVE   Leukocytes,Ua NEGATIVE NEGATIVE    Comment: Microscopic not done on urines with negative protein, blood, leukocytes, nitrite, or glucose < 500 mg/dL. Performed at [facility], [address] [place], [place] [address]   Pregnancy, urine     Status: None   Collection Time: 03/28/20  3:32 PM  Result Value Ref Range   Preg Test, Ur NEGATIVE NEGATIVE    Comment:        THE SENSITIVITY OF THIS METHODOLOGY IS >20 mIU/mL. Performed at [facility], [address] [place], [place] [address]   Resp Panel by RT-PCR (Flu A&B, Covid) Nasopharyngeal Swab     Status: None   Collection Time: 03/28/20  5:51 PM   Specimen: Nasopharyngeal Swab; Nasopharyngeal(NP) swabs in vial transport medium  Result Value Ref Range   SARS Coronavirus 2 by RT PCR NEGATIVE NEGATIVE    Comment: (NOTE) SARS-CoV-2 target nucleic acids are  NOT DETECTED.  The SARS-CoV-2 RNA is generally detectable in upper respiratory specimens during the acute phase of infection. The lowest concentration of SARS-CoV-2 viral copies this assay can detect is 138 copies/mL. A negative result does not preclude SARS-Cov-2 infection and should not be used as the sole basis for treatment or other patient management decisions. A negative result may occur with  improper specimen collection/handling, submission of specimen other than nasopharyngeal swab, presence of viral mutation(s) within the areas targeted by this assay, and inadequate number of viral copies(<138 copies/mL). A negative result must be combined with clinical observations, patient history, and epidemiological information. The expected result is Negative.  Fact Sheet for Patients:  BloggerCourse.com  Fact Sheet for Healthcare Providers:  SeriousBroker.it  This test is no t yet approved or cleared by the [place] FDA and  has been authorized for detection and/or diagnosis of SARS-CoV-2 by FDA under an Emergency Use Authorization (EUA). This EUA will remain  in effect (meaning this test can be used) for the duration of the COVID-19 declaration under Section 564(b)(1) of the Act, 21 U.S.C.section 360bbb-3(b)(1), unless the authorization is terminated  or revoked sooner.       Influenza A by PCR NEGATIVE NEGATIVE   Influenza B by PCR NEGATIVE NEGATIVE    Comment: (NOTE) The Xpert Xpress SARS-CoV-2/FLU/RSV plus assay is intended as an aid in the  diagnosis of influenza from Nasopharyngeal swab specimens and should not be used as a sole basis for treatment. Nasal washings and aspirates are unacceptable for Xpert Xpress SARS-CoV-2/FLU/RSV testing.  Fact Sheet for Patients: BloggerCourse.com  Fact Sheet for Healthcare Providers: SeriousBroker.it  This test is not yet approved or cleared by the [place] FDA and has been authorized for detection and/or diagnosis of SARS-CoV-2 by FDA under an Emergency Use Authorization (EUA). This EUA will remain in effect (meaning this test can be used) for the duration of the COVID-19 declaration under Section 564(b)(1) of the Act, 21 U.S.C. section 360bbb-3(b)(1), unless the authorization is terminated or revoked.  Performed at [facility], [address] [place], [place] [address]   HIV Antibody (routine testing w rflx)     Status: None   Collection Time: 03/29/20  4:19 AM  Result Value Ref Range   HIV Screen 4th Generation wRfx Non Reactive Non Reactive    Comment: Performed at [facility], [address], [place], [place] [address]  Comprehensive metabolic panel     Status: Abnormal   Collection Time: 03/29/20  4:19 AM  Result Value Ref Range   Sodium 138 135 - 145 mmol/L   Potassium 3.6 3.5 - 5.1 mmol/L   Chloride 103 98 - 111 mmol/L   CO2 25 22 - 32 mmol/L   Glucose, Bld 103 (H) 70 - 99 mg/dL    Comment: Glucose reference range applies only to samples taken after fasting for at least 8 hours.   BUN 8 6 - 20 mg/dL   Creatinine, Ser 4.82 0.44 - 1.00 mg/dL   Calcium 8.4 (L) 8.9 - 10.3 mg/dL   Total Protein 6.3 (L) 6.5 - 8.1 g/dL   Albumin 3.3 (L) 3.5 - 5.0 g/dL   AST 707 (H) 15 - 41 U/L   ALT 116 (H) 0 - 44 U/L   Alkaline Phosphatase 152 (H) 38 - 126 U/L   Total Bilirubin 1.4 (H) 0.3 - 1.2 mg/dL   GFR, Estimated >86 >75 mL/min    Comment: (NOTE) Calculated using the CKD-EPI  Creatinine Equation (2021)     Anion gap 10 5 - 15    Comment: Performed at [facility], [address] [place], [place] [address]   MR 3D Recon At Scanner  Result Date: 03/29/2020 CLINICAL DATA:  39-year-old female with history of right upper quadrant abdominal pain. Possible common bile duct dilatation noted on recent ultrasound examination. EXAM: MRI ABDOMEN WITHOUT AND WITH CONTRAST (INCLUDING MRCP) TECHNIQUE: Multiplanar multisequence MR imaging of the abdomen was performed both before and after the administration of intravenous contrast. Heavily T2-weighted images of the biliary and pancreatic ducts were obtained, and three-dimensional MRCP images were rendered by post processing. CONTRAST:  10mL GADAVIST GADOBUTROL 1 MMOL/ML IV SOLN COMPARISON:  No prior abdominal MRI. Abdominal ultrasound 03/28/2020. FINDINGS: Lower chest: Unremarkable. Hepatobiliary: Liver is enlarged measuring up to 23.2 cm in craniocaudal span. Severe diffuse loss of signal intensity throughout the hepatic parenchyma on out of phase dual echo images, indicative of a background of severe hepatic steatosis. No suspicious cystic or solid hepatic lesions. No intra or extrahepatic biliary ductal dilatation noted on MRCP images. Common bile duct measures up to 5 mm in the porta hepatis. No filling defect within the common bile duct to suggest choledocholithiasis. Multiple filling defects are noted within the gallbladder lying dependently, compatible with small gallstones. Gallbladder is not distended. Gallbladder wall thickness is normal. No pericholecystic fluid or surrounding inflammatory changes. Pancreas: No pancreatic mass. No pancreatic ductal dilatation noted on MRCP images. No pancreatic or peripancreatic fluid collections or inflammatory changes. Spleen:  Unremarkable. Adrenals/Urinary Tract: Subcentimeter T1 hypointense, T2 hyperintense, nonenhancing lesion in the lower pole of the left kidney, compatible with a simple cyst. Right kidney  and bilateral adrenal glands are normal in appearance. No hydroureteronephrosis in the visualized portions of the abdomen. Stomach/Bowel: Visualized portions are unremarkable. Vascular/Lymphatic: No aneurysm. No lymphadenopathy identified in the visualized abdominal vasculature noted in the visualized abdomen. Other: No significant volume of ascites noted in the visualized portions of the peritoneal cavity. Musculoskeletal: No aggressive appearing osseous lesions noted in the visualized portions of the skeleton. IMPRESSION: 1. Study is positive for cholelithiasis. However, there is no evidence of acute cholecystitis. Additionally, there is no evidence of choledocholithiasis or biliary tract obstruction. 2. Hepatomegaly with severe hepatic steatosis. Electronically Signed   By: Daniel  Entrikin M.D.   On: 03/29/2020 07:13   MR ABDOMEN MRCP W WO CONTAST  Result Date: 03/29/2020 CLINICAL DATA:  39-year-old female with history of right upper quadrant abdominal pain. Possible common bile duct dilatation noted on recent ultrasound examination. EXAM: MRI ABDOMEN WITHOUT AND WITH CONTRAST (INCLUDING MRCP) TECHNIQUE: Multiplanar multisequence MR imaging of the abdomen was performed both before and after the administration of intravenous contrast. Heavily T2-weighted images of the biliary and pancreatic ducts were obtained, and three-dimensional MRCP images were rendered by post processing. CONTRAST:  10mL GADAVIST GADOBUTROL 1 MMOL/ML IV SOLN COMPARISON:  No prior abdominal MRI. Abdominal ultrasound 03/28/2020. FINDINGS: Lower chest: Unremarkable. Hepatobiliary: Liver is enlarged measuring up to 23.2 cm in craniocaudal span. Severe diffuse loss of signal intensity throughout the hepatic parenchyma on out of phase dual echo images, indicative of a background of severe hepatic steatosis. No suspicious cystic or solid hepatic lesions. No intra or extrahepatic biliary ductal dilatation noted on MRCP images. Common bile duct  measures up to 5 mm in the porta hepatis. No filling defect within the common bile duct to suggest choledocholithiasis. Multiple filling defects are noted within the gallbladder lying dependently, compatible with   small gallstones. Gallbladder is not distended. Gallbladder wall thickness is normal. No pericholecystic fluid or surrounding inflammatory changes. Pancreas: No pancreatic mass. No pancreatic ductal dilatation noted on MRCP images. No pancreatic or peripancreatic fluid collections or inflammatory changes. Spleen:  Unremarkable. Adrenals/Urinary Tract: Subcentimeter T1 hypointense, T2 hyperintense, nonenhancing lesion in the lower pole of the left kidney, compatible with a simple cyst. Right kidney and bilateral adrenal glands are normal in appearance. No hydroureteronephrosis in the visualized portions of the abdomen. Stomach/Bowel: Visualized portions are unremarkable. Vascular/Lymphatic: No aneurysm. No lymphadenopathy identified in the visualized abdominal vasculature noted in the visualized abdomen. Other: No significant volume of ascites noted in the visualized portions of the peritoneal cavity. Musculoskeletal: No aggressive appearing osseous lesions noted in the visualized portions of the skeleton. IMPRESSION: 1. Study is positive for cholelithiasis. However, there is no evidence of acute cholecystitis. Additionally, there is no evidence of choledocholithiasis or biliary tract obstruction. 2. Hepatomegaly with severe hepatic steatosis. Electronically Signed   By: Trudie Reedaniel  Entrikin M.D.   On: 03/29/2020 07:13   US Abdomen Limited RUQ (LIVER/GB)  Result Date: 03/28/2020 CLINICAL DATA:  Right upper quadrant pain. EXAM: ULTRASOUND ABDOMEN LIMITED RIGHT UPPER QUADRANT COMPARISON:  None. FINDINGS: Gallbladder: Partially distended. Multiple intraluminal gallstones. No gallbladder wall thickening or pericholecystic fluid. No sonographic Murphy sign noted by sonographer. Common bile duct: Diameter: 9 mm,  dilated.  No visualized choledocholithiasis. Liver: Heterogeneously increased hepatic echogenicity. The liver parenchyma is difficult to penetrate. No evidence of focal lesion on this limited exam. Portal vein is patent on color Doppler imaging with normal direction of blood flow towards the liver. Other: No right upper quadrant ascites. IMPRESSION: 1. Gallstones without sonographic findings of acute cholecystitis. 2. Dilated common bile duct 9 mm. No visualized choledocholithiasis. Consider further evaluation with MRCP. MRCP should only be considered if patient is able to tolerate breath hold technique. 3. Hepatic steatosis. Electronically Signed   By: Narda RutherfordMelanie  Sanford M.D.   On: 03/28/2020 16:10      Assessment/Plan PCOS Hypothyroidism  Iron deficiency anemia  Symptomatic Cholelithiasis Possible choledocholithiasis - imaging shows cholelithiasis without concern for cholecystitis, MRCP without choledocholithiasis - LFTs and Tbili downtrending from admit - abdominal exam currently pretty benign - imaging does show hepatic steatosis and patient just got 80 mg of lovenox - Patient does warrant laparoscopic cholecystectomy but it would be better to wait until tomorrow and let lovenox wear off - will make NPO after MN and to hold any further lovenox, will post for OR tomorrow   FEN: reg diet, IVF, NPO after midnight  VTE: got 80 of lovenox this AM ID: no current abx  Juliet RudeKelly R Flornce Record, [facility] 03/29/2020, 10:47 AM Please see Amion for pager number during day hours 7:00am-4:30pm

## 2020-03-29 NOTE — H&P (View-Only) (Signed)
Consult Note  Brittany Esparza 11-27-1980  [id number].    Requesting MD: Rhona Leavens Chief Complaint/Reason for Consult: Symptomatic cholelithiasis HPI:  Patient is a 39 year old female who presented to [facility] with abdominal pain, nausea, and vomiting. Pain started 2-3 days ago and worsened on day of presentation. Pain constant in RUQ and radiates around R flank. Reports nausea and darkened urine. Currently symptoms have resolved. Denies fever, chills, chest pain, SOB. PMH otherwise significant for PCOS, hypothyroidism and obesity. Past abdominal surgery includes cesarean section. Not on any blood thinning meds at home. Allergy to PCNs. Drinks alcohol rarely and quit smoking 9 years ago. Denies illicit drug use. Works at a [employer] but primarily can be at a desk if needed. Husband present at bedside.   ROS: Review of Systems  Constitutional: Negative for chills and fever.  Respiratory: Negative for shortness of breath and wheezing.   Cardiovascular: Negative for chest pain and palpitations.  Gastrointestinal: Positive for abdominal pain and nausea. Negative for blood in stool, constipation, diarrhea and vomiting.  Genitourinary: Negative for dysuria, frequency and urgency.       Orange urine  All other systems reviewed and are negative.   No family history on file.  Past Medical History:  Diagnosis Date  . Anemia   . Depression   . GERD (gastroesophageal reflux disease)   . PCOS (polycystic ovarian syndrome)   . Thyroid disease   . Vitamin D deficiency     Past Surgical History:  Procedure Laterality Date  . CESAREAN SECTION    . WISDOM TOOTH EXTRACTION      Social History:  reports that she has never smoked. She has never used smokeless tobacco. She reports current alcohol use. She reports that she does not use drugs.  Allergies:  Allergies  Allergen Reactions  . Penicillins Dermatitis    Medications Prior to Admission  Medication Sig Dispense Refill   . levothyroxine (SYNTHROID) 112 MCG tablet Take 224 mcg by mouth daily before breakfast.    . pantoprazole (PROTONIX) 40 MG tablet Take 40 mg by mouth daily.    . sertraline (ZOLOFT) 50 MG tablet Take 150 mg by mouth daily.      Blood pressure (!) 116/52, pulse 74, temperature 98.3 F (36.8 C), temperature source Oral, resp. rate 18, height 5\' 5"  (1.651 m), weight (!) 159.7 kg, last menstrual period 03/04/2020, SpO2 98 %. Physical Exam:  General: pleasant, WD, morbidly obese female who is laying in bed in NAD HEENT: head is normocephalic, atraumatic.  Sclera are anicteric.  PERRL.  Ears and nose without any masses or lesions.  Mouth is pink and moist Heart: regular, rate, and rhythm.  Normal s1,s2. No obvious murmurs, gallops, or rubs noted.  Palpable radial and pedal pulses bilaterally Lungs: CTAB, no wheezes, rhonchi, or rales noted.  Respiratory effort nonlabored Abd: soft, NT, ND, +BS, no masses, hernias, or organomegaly MS: all 4 extremities are symmetrical with no cyanosis, clubbing, or edema. Skin: warm and dry with no masses, lesions, or rashes Neuro: Cranial nerves 2-12 grossly intact, sensation is normal throughout Psych: A&Ox3 with an appropriate affect.   Results for orders placed or performed during the hospital encounter of 03/28/20 (from the past 48 hour(s))  Lipase, blood     Status: None   Collection Time: 03/28/20  3:32 PM  Result Value Ref Range   Lipase 23 11 - 51 U/L    Comment: Performed at [facility], [address] [place], [place] [address]  Comprehensive metabolic panel     Status: Abnormal   Collection Time: 03/28/20  3:32 PM  Result Value Ref Range   Sodium 136 135 - 145 mmol/L   Potassium 3.7 3.5 - 5.1 mmol/L   Chloride 103 98 - 111 mmol/L   CO2 23 22 - 32 mmol/L   Glucose, Bld 97 70 - 99 mg/dL    Comment: Glucose reference range applies only to samples taken after fasting for at least 8 hours.   BUN 8 6 - 20 mg/dL   Creatinine, Ser 6.04  0.44 - 1.00 mg/dL   Calcium 8.6 (L) 8.9 - 10.3 mg/dL   Total Protein 7.2 6.5 - 8.1 g/dL   Albumin 3.8 3.5 - 5.0 g/dL   AST 540 (H) 15 - 41 U/L   ALT 149 (H) 0 - 44 U/L   Alkaline Phosphatase 175 (H) 38 - 126 U/L   Total Bilirubin 3.0 (H) 0.3 - 1.2 mg/dL   GFR, Estimated >98 >11 mL/min    Comment: (NOTE) Calculated using the CKD-EPI Creatinine Equation (2021)    Anion gap 10 5 - 15    Comment: Performed at [facility], [address] [place], [place] [address]  CBC     Status: Abnormal   Collection Time: 03/28/20  3:32 PM  Result Value Ref Range   WBC 7.5 4.0 - 10.5 K/uL   RBC 5.25 (H) 3.87 - 5.11 MIL/uL   Hemoglobin 13.5 12.0 - 15.0 g/dL   HCT 29.5 62.1 - 30.8 %   MCV 79.4 (L) 80.0 - 100.0 fL   MCH 25.7 (L) 26.0 - 34.0 pg   MCHC 32.4 30.0 - 36.0 g/dL   RDW 65.7 84.6 - 96.2 %   Platelets 205 150 - 400 K/uL   nRBC 0.0 0.0 - 0.2 %    Comment: Performed at [facility], [address] [place], [place] [address]  Urinalysis, Routine w reflex microscopic Urine, Clean Catch     Status: Abnormal   Collection Time: 03/28/20  3:32 PM  Result Value Ref Range   Color, Urine YELLOW YELLOW   APPearance HAZY (A) CLEAR   Specific Gravity, Urine 1.010 1.005 - 1.030   pH 7.5 5.0 - 8.0   Glucose, UA NEGATIVE NEGATIVE mg/dL   Hgb urine dipstick NEGATIVE NEGATIVE   Bilirubin Urine SMALL (A) NEGATIVE   Ketones, ur NEGATIVE NEGATIVE mg/dL   Protein, ur NEGATIVE NEGATIVE mg/dL   Nitrite NEGATIVE NEGATIVE   Leukocytes,Ua NEGATIVE NEGATIVE    Comment: Microscopic not done on urines with negative protein, blood, leukocytes, nitrite, or glucose < 500 mg/dL. Performed at [facility], [address] [place], [place] [address]   Pregnancy, urine     Status: None   Collection Time: 03/28/20  3:32 PM  Result Value Ref Range   Preg Test, Ur NEGATIVE NEGATIVE    Comment:        THE SENSITIVITY OF THIS METHODOLOGY IS >20 mIU/mL. Performed at [facility], [address] [place], [place] [address]   Resp Panel by RT-PCR (Flu A&B, Covid) Nasopharyngeal Swab     Status: None   Collection Time: 03/28/20  5:51 PM   Specimen: Nasopharyngeal Swab; Nasopharyngeal(NP) swabs in vial transport medium  Result Value Ref Range   SARS Coronavirus 2 by RT PCR NEGATIVE NEGATIVE    Comment: (NOTE) SARS-CoV-2 target nucleic acids are  NOT DETECTED.  The SARS-CoV-2 RNA is generally detectable in upper respiratory specimens during the acute phase of infection. The lowest concentration of SARS-CoV-2 viral copies this assay can detect is 138 copies/mL. A negative result does not preclude SARS-Cov-2 infection and should not be used as the sole basis for treatment or other patient management decisions. A negative result may occur with  improper specimen collection/handling, submission of specimen other than nasopharyngeal swab, presence of viral mutation(s) within the areas targeted by this assay, and inadequate number of viral copies(<138 copies/mL). A negative result must be combined with clinical observations, patient history, and epidemiological information. The expected result is Negative.  Fact Sheet for Patients:  BloggerCourse.com  Fact Sheet for Healthcare Providers:  SeriousBroker.it  This test is no t yet approved or cleared by the [place] FDA and  has been authorized for detection and/or diagnosis of SARS-CoV-2 by FDA under an Emergency Use Authorization (EUA). This EUA will remain  in effect (meaning this test can be used) for the duration of the COVID-19 declaration under Section 564(b)(1) of the Act, 21 U.S.C.section 360bbb-3(b)(1), unless the authorization is terminated  or revoked sooner.       Influenza A by PCR NEGATIVE NEGATIVE   Influenza B by PCR NEGATIVE NEGATIVE    Comment: (NOTE) The Xpert Xpress SARS-CoV-2/FLU/RSV plus assay is intended as an aid in the  diagnosis of influenza from Nasopharyngeal swab specimens and should not be used as a sole basis for treatment. Nasal washings and aspirates are unacceptable for Xpert Xpress SARS-CoV-2/FLU/RSV testing.  Fact Sheet for Patients: BloggerCourse.com  Fact Sheet for Healthcare Providers: SeriousBroker.it  This test is not yet approved or cleared by the [place] FDA and has been authorized for detection and/or diagnosis of SARS-CoV-2 by FDA under an Emergency Use Authorization (EUA). This EUA will remain in effect (meaning this test can be used) for the duration of the COVID-19 declaration under Section 564(b)(1) of the Act, 21 U.S.C. section 360bbb-3(b)(1), unless the authorization is terminated or revoked.  Performed at [facility], [address] [place], [place] [address]   HIV Antibody (routine testing w rflx)     Status: None   Collection Time: 03/29/20  4:19 AM  Result Value Ref Range   HIV Screen 4th Generation wRfx Non Reactive Non Reactive    Comment: Performed at [facility], [address], [place], [place] [address]  Comprehensive metabolic panel     Status: Abnormal   Collection Time: 03/29/20  4:19 AM  Result Value Ref Range   Sodium 138 135 - 145 mmol/L   Potassium 3.6 3.5 - 5.1 mmol/L   Chloride 103 98 - 111 mmol/L   CO2 25 22 - 32 mmol/L   Glucose, Bld 103 (H) 70 - 99 mg/dL    Comment: Glucose reference range applies only to samples taken after fasting for at least 8 hours.   BUN 8 6 - 20 mg/dL   Creatinine, Ser 4.82 0.44 - 1.00 mg/dL   Calcium 8.4 (L) 8.9 - 10.3 mg/dL   Total Protein 6.3 (L) 6.5 - 8.1 g/dL   Albumin 3.3 (L) 3.5 - 5.0 g/dL   AST 707 (H) 15 - 41 U/L   ALT 116 (H) 0 - 44 U/L   Alkaline Phosphatase 152 (H) 38 - 126 U/L   Total Bilirubin 1.4 (H) 0.3 - 1.2 mg/dL   GFR, Estimated >86 >75 mL/min    Comment: (NOTE) Calculated using the CKD-EPI  Creatinine Equation (2021)     Anion gap 10 5 - 15    Comment: Performed at [facility], [address], [place], [place] [address]   MR 3D Recon At Scanner  Result Date: 03/29/2020 CLINICAL DATA:  39 year old female with history of right upper quadrant abdominal pain. Possible common bile duct dilatation noted on recent ultrasound examination. EXAM: MRI ABDOMEN WITHOUT AND WITH CONTRAST (INCLUDING MRCP) TECHNIQUE: Multiplanar multisequence MR imaging of the abdomen was performed both before and after the administration of intravenous contrast. Heavily T2-weighted images of the biliary and pancreatic ducts were obtained, and three-dimensional MRCP images were rendered by post processing. CONTRAST:  10mL GADAVIST GADOBUTROL 1 MMOL/ML IV SOLN COMPARISON:  No prior abdominal MRI. Abdominal ultrasound 03/28/2020. FINDINGS: Lower chest: Unremarkable. Hepatobiliary: Liver is enlarged measuring up to 23.2 cm in craniocaudal span. Severe diffuse loss of signal intensity throughout the hepatic parenchyma on out of phase dual echo images, indicative of a background of severe hepatic steatosis. No suspicious cystic or solid hepatic lesions. No intra or extrahepatic biliary ductal dilatation noted on MRCP images. Common bile duct measures up to 5 mm in the porta hepatis. No filling defect within the common bile duct to suggest choledocholithiasis. Multiple filling defects are noted within the gallbladder lying dependently, compatible with small gallstones. Gallbladder is not distended. Gallbladder wall thickness is normal. No pericholecystic fluid or surrounding inflammatory changes. Pancreas: No pancreatic mass. No pancreatic ductal dilatation noted on MRCP images. No pancreatic or peripancreatic fluid collections or inflammatory changes. Spleen:  Unremarkable. Adrenals/Urinary Tract: Subcentimeter T1 hypointense, T2 hyperintense, nonenhancing lesion in the lower pole of the left kidney, compatible with a simple cyst. Right kidney  and bilateral adrenal glands are normal in appearance. No hydroureteronephrosis in the visualized portions of the abdomen. Stomach/Bowel: Visualized portions are unremarkable. Vascular/Lymphatic: No aneurysm. No lymphadenopathy identified in the visualized abdominal vasculature noted in the visualized abdomen. Other: No significant volume of ascites noted in the visualized portions of the peritoneal cavity. Musculoskeletal: No aggressive appearing osseous lesions noted in the visualized portions of the skeleton. IMPRESSION: 1. Study is positive for cholelithiasis. However, there is no evidence of acute cholecystitis. Additionally, there is no evidence of choledocholithiasis or biliary tract obstruction. 2. Hepatomegaly with severe hepatic steatosis. Electronically Signed   By: Trudie Reed M.D.   On: 03/29/2020 07:13   MR ABDOMEN MRCP W WO CONTAST  Result Date: 03/29/2020 CLINICAL DATA:  39 year old female with history of right upper quadrant abdominal pain. Possible common bile duct dilatation noted on recent ultrasound examination. EXAM: MRI ABDOMEN WITHOUT AND WITH CONTRAST (INCLUDING MRCP) TECHNIQUE: Multiplanar multisequence MR imaging of the abdomen was performed both before and after the administration of intravenous contrast. Heavily T2-weighted images of the biliary and pancreatic ducts were obtained, and three-dimensional MRCP images were rendered by post processing. CONTRAST:  10mL GADAVIST GADOBUTROL 1 MMOL/ML IV SOLN COMPARISON:  No prior abdominal MRI. Abdominal ultrasound 03/28/2020. FINDINGS: Lower chest: Unremarkable. Hepatobiliary: Liver is enlarged measuring up to 23.2 cm in craniocaudal span. Severe diffuse loss of signal intensity throughout the hepatic parenchyma on out of phase dual echo images, indicative of a background of severe hepatic steatosis. No suspicious cystic or solid hepatic lesions. No intra or extrahepatic biliary ductal dilatation noted on MRCP images. Common bile duct  measures up to 5 mm in the porta hepatis. No filling defect within the common bile duct to suggest choledocholithiasis. Multiple filling defects are noted within the gallbladder lying dependently, compatible with  small gallstones. Gallbladder is not distended. Gallbladder wall thickness is normal. No pericholecystic fluid or surrounding inflammatory changes. Pancreas: No pancreatic mass. No pancreatic ductal dilatation noted on MRCP images. No pancreatic or peripancreatic fluid collections or inflammatory changes. Spleen:  Unremarkable. Adrenals/Urinary Tract: Subcentimeter T1 hypointense, T2 hyperintense, nonenhancing lesion in the lower pole of the left kidney, compatible with a simple cyst. Right kidney and bilateral adrenal glands are normal in appearance. No hydroureteronephrosis in the visualized portions of the abdomen. Stomach/Bowel: Visualized portions are unremarkable. Vascular/Lymphatic: No aneurysm. No lymphadenopathy identified in the visualized abdominal vasculature noted in the visualized abdomen. Other: No significant volume of ascites noted in the visualized portions of the peritoneal cavity. Musculoskeletal: No aggressive appearing osseous lesions noted in the visualized portions of the skeleton. IMPRESSION: 1. Study is positive for cholelithiasis. However, there is no evidence of acute cholecystitis. Additionally, there is no evidence of choledocholithiasis or biliary tract obstruction. 2. Hepatomegaly with severe hepatic steatosis. Electronically Signed   By: Trudie Reedaniel  Entrikin M.D.   On: 03/29/2020 07:13   US Abdomen Limited RUQ (LIVER/GB)  Result Date: 03/28/2020 CLINICAL DATA:  Right upper quadrant pain. EXAM: ULTRASOUND ABDOMEN LIMITED RIGHT UPPER QUADRANT COMPARISON:  None. FINDINGS: Gallbladder: Partially distended. Multiple intraluminal gallstones. No gallbladder wall thickening or pericholecystic fluid. No sonographic Murphy sign noted by sonographer. Common bile duct: Diameter: 9 mm,  dilated.  No visualized choledocholithiasis. Liver: Heterogeneously increased hepatic echogenicity. The liver parenchyma is difficult to penetrate. No evidence of focal lesion on this limited exam. Portal vein is patent on color Doppler imaging with normal direction of blood flow towards the liver. Other: No right upper quadrant ascites. IMPRESSION: 1. Gallstones without sonographic findings of acute cholecystitis. 2. Dilated common bile duct 9 mm. No visualized choledocholithiasis. Consider further evaluation with MRCP. MRCP should only be considered if patient is able to tolerate breath hold technique. 3. Hepatic steatosis. Electronically Signed   By: Narda RutherfordMelanie  Sanford M.D.   On: 03/28/2020 16:10      Assessment/Plan PCOS Hypothyroidism  Iron deficiency anemia  Symptomatic Cholelithiasis Possible choledocholithiasis - imaging shows cholelithiasis without concern for cholecystitis, MRCP without choledocholithiasis - LFTs and Tbili downtrending from admit - abdominal exam currently pretty benign - imaging does show hepatic steatosis and patient just got 80 mg of lovenox - Patient does warrant laparoscopic cholecystectomy but it would be better to wait until tomorrow and let lovenox wear off - will make NPO after MN and to hold any further lovenox, will post for OR tomorrow   FEN: reg diet, IVF, NPO after midnight  VTE: got 80 of lovenox this AM ID: no current abx  Juliet RudeKelly R Jaidev Sanger, [facility] 03/29/2020, 10:47 AM Please see Amion for pager number during day hours 7:00am-4:30pm

## 2020-03-29 NOTE — Consult Note (Addendum)
Referring Provider: [facility] Primary Care Physician:  Alfonso Patten, MD Primary Gastroenterologist:  Gentry Fitz  Reason for Consultation:  Transaminitis  HPI: Brittany Esparza is a 39 y.o. female with past medical history of GERD and obesity (BMI 58.58) presenting for consultation of right upper quadrant abdominal pain and transaminitis.  Patient states that last week towards the end of the week, she woke up with severe right upper quadrant pain that radiated to her back.  Pain eventually subsided.  However, on Wednesday, she ate a sausage biscuit and then proceeded to have recurrence of right upper quadrant abdominal pain.  Yesterday, her urine turned dark, which led to her presentation to an urgent care.  She was found to have transaminitis with an elevated T bili of 3.0 and thus went to the ED.  Initial ultrasound revealed dilated CBD to 9 mm.  Patient was transferred to [facility] from outside ED.  Patient had an MRCP this morning which showed a normal CBD without choledocholithiasis but was pertinent for cholelithiasis without cholecystitis.  Patient has associated nausea but denies vomiting.  Denies any changes in stools, melena, or hematochezia.  Denies dysphagia, changes in appetite, early satiety, unexplained weight loss.    Patient states she has had intermittent epigastric to right upper quadrant pain previously but did not think anything of it at the time.  Denies any recent alcohol use.  She took Tylenol once last week for pain but otherwise denies heavy Tylenol use.  Family history pertinent for mother with prior cholecystectomy for gangrenous gallbladder.  Patient denies other family history of any gastrointestinal disorders or malignancies, though notes her mother was adopted.  MRCP 03/29/20: cholelithiasis without cholecystitis. No intra or extrahepatic biliary ductal dilatation noted on MRCP images. Common bile duct measures up to 5 mm. No filling defect within the  common bile duct to suggest choledocholithiasis.   Past Medical History:  Diagnosis Date  . Anemia   . Depression   . GERD (gastroesophageal reflux disease)   . PCOS (polycystic ovarian syndrome)   . Thyroid disease   . Vitamin D deficiency     Past Surgical History:  Procedure Laterality Date  . CESAREAN SECTION    . WISDOM TOOTH EXTRACTION      Prior to Admission medications   Medication Sig Start Date End Date Taking? Authorizing Provider  levothyroxine (SYNTHROID) 112 MCG tablet Take 224 mcg by mouth daily before breakfast.   Yes [provider]  pantoprazole (PROTONIX) 40 MG tablet Take 40 mg by mouth daily.   Yes [provider]  sertraline (ZOLOFT) 50 MG tablet Take 150 mg by mouth daily.   Yes [provider]    Scheduled Meds: . enoxaparin (LOVENOX) injection  80 mg Subcutaneous Q24H  . levothyroxine  224 mcg Oral Q0600  . multivitamin with minerals  1 tablet Oral Daily  . pantoprazole  40 mg Oral Daily  . sertraline  150 mg Oral Daily   Continuous Infusions: PRN Meds:.bisacodyl, oxyCODONE, senna-docusate  Allergies as of 03/28/2020 - Review Complete 03/28/2020  Allergen Reaction Noted  . Penicillins Dermatitis 03/12/2011    No family history on file.  Social History   Socioeconomic History  . Marital status: Married    Spouse name: Not on file  . Number of children: Not on file  . Years of education: Not on file  . Highest education level: Not on file  Occupational History  . Not on file  Tobacco Use  . Smoking status: Never  Smoker  . Smokeless tobacco: Never Used  Substance and Sexual Activity  . Alcohol use: Yes    Comment: rare  . Drug use: Never  . Sexual activity: Not on file  Other Topics Concern  . Not on file  Social History Narrative  . Not on file   Social Determinants of Health   Financial Resource Strain: Not on file  Food Insecurity: Not on file  Transportation Needs: Not on file  Physical  Activity: Not on file  Stress: Not on file  Social Connections: Not on file  Intimate Partner Violence: Not on file    Review of Systems: Review of Systems  Constitutional: Negative for chills, fever and weight loss.  HENT: Negative for hearing loss and tinnitus.   Eyes: Negative for pain and redness.  Respiratory: Negative for cough and shortness of breath.   Cardiovascular: Negative for chest pain and palpitations.  Gastrointestinal: Positive for abdominal pain, heartburn and nausea. Negative for blood in stool, constipation, diarrhea, melena and vomiting.  Genitourinary: Negative for flank pain and hematuria.  Musculoskeletal: Negative for falls and joint pain.  Skin: Negative for itching and rash.  Neurological: Negative for seizures and loss of consciousness.  Endo/Heme/Allergies: Negative for polydipsia. Does not bruise/bleed easily.  Psychiatric/Behavioral: Negative for substance abuse. The patient is not nervous/anxious.      Physical Exam: Vital signs: Vitals:   03/29/20 0153 03/29/20 0544  BP: (!) 91/53 111/69  Pulse: 79 72  Resp: 18 19  Temp: 97.8 F (36.6 C) 98.8 F (37.1 C)  SpO2: 96% 97%   Last BM Date: 03/26/20  Physical Exam Vitals reviewed.  Constitutional:      General: She is not in acute distress.    Appearance: She is obese.  HENT:     Head: Normocephalic and atraumatic.     Mouth/Throat:     Mouth: Mucous membranes are moist.     Pharynx: Oropharynx is clear.  Eyes:     General: No scleral icterus.    Extraocular Movements: Extraocular movements intact.  Cardiovascular:     Rate and Rhythm: Normal rate and regular rhythm.     Pulses: Normal pulses.  Pulmonary:     Effort: Pulmonary effort is normal. No respiratory distress.     Breath sounds: Normal breath sounds.  Abdominal:     General: Bowel sounds are normal. There is no distension.     Palpations: Abdomen is soft. There is no mass.     Tenderness: There is abdominal tenderness (mild  epigastric, RUQ). There is no guarding or rebound.     Hernia: No hernia is present.  Musculoskeletal:        General: No swelling or tenderness.     Cervical back: Normal range of motion and neck supple.  Skin:    General: Skin is warm and dry.     Coloration: Skin is not jaundiced.  Neurological:     General: No focal deficit present.     Mental Status: She is oriented to person, place, and time. She is lethargic.  Psychiatric:        Mood and Affect: Mood normal.        Behavior: Behavior normal. Behavior is cooperative.      GI:  Lab Results: Recent Labs    03/28/20 1532  WBC 7.5  HGB 13.5  HCT 41.7  PLT 205   BMET Recent Labs    03/28/20 1532 03/29/20 0419  NA 136 138  K 3.7 3.6  CL 103 103  CO2 23 25  GLUCOSE 97 103*  BUN 8 8  CREATININE 0.68 0.77  CALCIUM 8.6* 8.4*   LFT Recent Labs    03/29/20 0419  PROT 6.3*  ALBUMIN 3.3*  AST 107*  ALT 116*  ALKPHOS 152*  BILITOT 1.4*   PT/INR No results for input(s): LABPROT, INR in the last 72 hours.   Studies/Results: MR 3D Recon At Scanner  Result Date: 03/29/2020 CLINICAL DATA:  39 year old female with history of right upper quadrant abdominal pain. Possible common bile duct dilatation noted on recent ultrasound examination. EXAM: MRI ABDOMEN WITHOUT AND WITH CONTRAST (INCLUDING MRCP) TECHNIQUE: Multiplanar multisequence MR imaging of the abdomen was performed both before and after the administration of intravenous contrast. Heavily T2-weighted images of the biliary and pancreatic ducts were obtained, and three-dimensional MRCP images were rendered by post processing. CONTRAST:  62mL GADAVIST GADOBUTROL 1 MMOL/ML IV SOLN COMPARISON:  No prior abdominal MRI. Abdominal ultrasound 03/28/2020. FINDINGS: Lower chest: Unremarkable. Hepatobiliary: Liver is enlarged measuring up to 23.2 cm in craniocaudal span. Severe diffuse loss of signal intensity throughout the hepatic parenchyma on out of phase dual echo  images, indicative of a background of severe hepatic steatosis. No suspicious cystic or solid hepatic lesions. No intra or extrahepatic biliary ductal dilatation noted on MRCP images. Common bile duct measures up to 5 mm in the porta hepatis. No filling defect within the common bile duct to suggest choledocholithiasis. Multiple filling defects are noted within the gallbladder lying dependently, compatible with small gallstones. Gallbladder is not distended. Gallbladder wall thickness is normal. No pericholecystic fluid or surrounding inflammatory changes. Pancreas: No pancreatic mass. No pancreatic ductal dilatation noted on MRCP images. No pancreatic or peripancreatic fluid collections or inflammatory changes. Spleen:  Unremarkable. Adrenals/Urinary Tract: Subcentimeter T1 hypointense, T2 hyperintense, nonenhancing lesion in the lower pole of the left kidney, compatible with a simple cyst. Right kidney and bilateral adrenal glands are normal in appearance. No hydroureteronephrosis in the visualized portions of the abdomen. Stomach/Bowel: Visualized portions are unremarkable. Vascular/Lymphatic: No aneurysm. No lymphadenopathy identified in the visualized abdominal vasculature noted in the visualized abdomen. Other: No significant volume of ascites noted in the visualized portions of the peritoneal cavity. Musculoskeletal: No aggressive appearing osseous lesions noted in the visualized portions of the skeleton. IMPRESSION: 1. Study is positive for cholelithiasis. However, there is no evidence of acute cholecystitis. Additionally, there is no evidence of choledocholithiasis or biliary tract obstruction. 2. Hepatomegaly with severe hepatic steatosis. Electronically Signed   By: Trudie Reed M.D.   On: 03/29/2020 07:13   MR ABDOMEN MRCP W WO CONTAST  Result Date: 03/29/2020 CLINICAL DATA:  39 year old female with history of right upper quadrant abdominal pain. Possible common bile duct dilatation noted on  recent ultrasound examination. EXAM: MRI ABDOMEN WITHOUT AND WITH CONTRAST (INCLUDING MRCP) TECHNIQUE: Multiplanar multisequence MR imaging of the abdomen was performed both before and after the administration of intravenous contrast. Heavily T2-weighted images of the biliary and pancreatic ducts were obtained, and three-dimensional MRCP images were rendered by post processing. CONTRAST:  48mL GADAVIST GADOBUTROL 1 MMOL/ML IV SOLN COMPARISON:  No prior abdominal MRI. Abdominal ultrasound 03/28/2020. FINDINGS: Lower chest: Unremarkable. Hepatobiliary: Liver is enlarged measuring up to 23.2 cm in craniocaudal span. Severe diffuse loss of signal intensity throughout the hepatic parenchyma on out of phase dual echo images, indicative of a background of severe hepatic steatosis. No suspicious cystic or solid hepatic lesions. No intra or extrahepatic biliary ductal dilatation noted on MRCP  images. Common bile duct measures up to 5 mm in the porta hepatis. No filling defect within the common bile duct to suggest choledocholithiasis. Multiple filling defects are noted within the gallbladder lying dependently, compatible with small gallstones. Gallbladder is not distended. Gallbladder wall thickness is normal. No pericholecystic fluid or surrounding inflammatory changes. Pancreas: No pancreatic mass. No pancreatic ductal dilatation noted on MRCP images. No pancreatic or peripancreatic fluid collections or inflammatory changes. Spleen:  Unremarkable. Adrenals/Urinary Tract: Subcentimeter T1 hypointense, T2 hyperintense, nonenhancing lesion in the lower pole of the left kidney, compatible with a simple cyst. Right kidney and bilateral adrenal glands are normal in appearance. No hydroureteronephrosis in the visualized portions of the abdomen. Stomach/Bowel: Visualized portions are unremarkable. Vascular/Lymphatic: No aneurysm. No lymphadenopathy identified in the visualized abdominal vasculature noted in the visualized abdomen.  Other: No significant volume of ascites noted in the visualized portions of the peritoneal cavity. Musculoskeletal: No aggressive appearing osseous lesions noted in the visualized portions of the skeleton. IMPRESSION: 1. Study is positive for cholelithiasis. However, there is no evidence of acute cholecystitis. Additionally, there is no evidence of choledocholithiasis or biliary tract obstruction. 2. Hepatomegaly with severe hepatic steatosis. Electronically Signed   By: Trudie Reedaniel  Entrikin M.D.   On: 03/29/2020 07:13   US Abdomen Limited RUQ (LIVER/GB)  Result Date: 03/28/2020 CLINICAL DATA:  Right upper quadrant pain. EXAM: ULTRASOUND ABDOMEN LIMITED RIGHT UPPER QUADRANT COMPARISON:  None. FINDINGS: Gallbladder: Partially distended. Multiple intraluminal gallstones. No gallbladder wall thickening or pericholecystic fluid. No sonographic Murphy sign noted by sonographer. Common bile duct: Diameter: 9 mm, dilated.  No visualized choledocholithiasis. Liver: Heterogeneously increased hepatic echogenicity. The liver parenchyma is difficult to penetrate. No evidence of focal lesion on this limited exam. Portal vein is patent on color Doppler imaging with normal direction of blood flow towards the liver. Other: No right upper quadrant ascites. IMPRESSION: 1. Gallstones without sonographic findings of acute cholecystitis. 2. Dilated common bile duct 9 mm. No visualized choledocholithiasis. Consider further evaluation with MRCP. MRCP should only be considered if patient is able to tolerate breath hold technique. 3. Hepatic steatosis. Electronically Signed   By: Narda RutherfordMelanie  Sanford M.D.   On: 03/28/2020 16:10    Impression: Symptomatic cholelithiasis, possible passed CBD stone -LFTs trending down.  Today, T bili 1.4/AST 107/ALT 116/ALP 152 as compared to yesterday T bili 3.0/AST 176/ALT 149/ALP 175 -Lipase normal, 23 -MRCP today revealed cholelithiasis without cholecystitis, normal CBD without  choledocholithiasis  Severe hepatic steatosis, patient reports liver bx by [facility] in 2016.  Per chart review, bx 06/2014:  MODERATE TO SEVERE STEATOSIS.  MILD PORTAL FIBROSIS (STAGE 1).  NO SIGNIFICANT PORTAL INFLAMMATION.  NEGATIVE FOR SIDEROSIS BY IRON STAIN.  NEGATIVE FOR CIRRHOSIS (CONFIRMED WITH TRICHROME STAIN). Patient states she is not currently following with [facility].  Plan:. Surgical consultation for consideration of laparoscopic cholecystectomy with IOC.  Discussed hepatic steatosis with patient.  Recommend weight loss, 1 to 2 pounds per week with goal of 5 to 7% weight loss.  Also advised patient of possibility for fatty liver to progress to cirrhosis.  Recommended follow-up in our office for further evaluation and management.  Patient states she does not wish to schedule FU at this time, as she states [facility] did not advise further management.  Patient given our card with contact information in case she changes her mind and wishes to reschedule.   LOS: 1 day   Edrick KinsAlison Baron-Johnson  PA-C 03/29/2020, 8:35 AM  Contact #  [phone]

## 2020-03-29 NOTE — Plan of Care (Signed)
  Problem: Clinical Measurements: Goal: Ability to maintain clinical measurements within normal limits will improve Outcome: Progressing   Problem: Clinical Measurements: Goal: Ability to maintain clinical measurements within normal limits will improve Outcome: Progressing Goal: Will remain free from infection Outcome: Progressing Goal: Diagnostic test results will improve Outcome: Progressing Goal: Respiratory complications will improve Outcome: Progressing Goal: Cardiovascular complication will be avoided Outcome: Progressing   Problem: Clinical Measurements: Goal: Will remain free from infection Outcome: Progressing Goal: Diagnostic test results will improve Outcome: Progressing Goal: Respiratory complications will improve Outcome: Progressing Goal: Cardiovascular complication will be avoided Outcome: Progressing

## 2020-03-30 ENCOUNTER — Encounter (HOSPITAL_COMMUNITY)
Admission: EM | Disposition: A | Discharge status: Home / Self Care | Payer: Self-pay | Source: Home / Self Care | Attending: Emergency Medicine

## 2020-03-30 ENCOUNTER — Observation Stay (HOSPITAL_COMMUNITY): Payer: 59

## 2020-03-30 ENCOUNTER — Other Ambulatory Visit: Payer: Self-pay

## 2020-03-30 ENCOUNTER — Observation Stay (HOSPITAL_COMMUNITY): Payer: 59 | Admitting: Certified Registered Nurse Anesthetist

## 2020-03-30 DIAGNOSIS — R7989 Other specified abnormal findings of blood chemistry: Secondary | ICD-10-CM

## 2020-03-30 DIAGNOSIS — R933 Abnormal findings on diagnostic imaging of other parts of digestive tract: Secondary | ICD-10-CM

## 2020-03-30 DIAGNOSIS — E282 Polycystic ovarian syndrome: Secondary | ICD-10-CM | POA: Insufficient documentation

## 2020-03-30 DIAGNOSIS — K8000 Calculus of gallbladder with acute cholecystitis without obstruction: Secondary | ICD-10-CM | POA: Diagnosis not present

## 2020-03-30 DIAGNOSIS — K8071 Calculus of gallbladder and bile duct without cholecystitis with obstruction: Secondary | ICD-10-CM | POA: Diagnosis not present

## 2020-03-30 DIAGNOSIS — E559 Vitamin D deficiency, unspecified: Secondary | ICD-10-CM | POA: Insufficient documentation

## 2020-03-30 DIAGNOSIS — K7581 Nonalcoholic steatohepatitis (NASH): Secondary | ICD-10-CM

## 2020-03-30 HISTORY — PX: CHOLECYSTECTOMY: SHX55

## 2020-03-30 LAB — COMPREHENSIVE METABOLIC PANEL
ALT: 147 U/L — ABNORMAL HIGH (ref 0–44)
ALT: 207 U/L — ABNORMAL HIGH (ref 0–44)
AST: 159 U/L — ABNORMAL HIGH (ref 15–41)
AST: 234 U/L — ABNORMAL HIGH (ref 15–41)
Albumin: 3.1 g/dL — ABNORMAL LOW (ref 3.5–5.0)
Albumin: 3.4 g/dL — ABNORMAL LOW (ref 3.5–5.0)
Alkaline Phosphatase: 195 U/L — ABNORMAL HIGH (ref 38–126)
Alkaline Phosphatase: 223 U/L — ABNORMAL HIGH (ref 38–126)
Anion gap: 10 (ref 5–15)
Anion gap: 11 (ref 5–15)
BUN: 9 mg/dL (ref 6–20)
BUN: 9 mg/dL (ref 6–20)
CO2: 28 mmol/L (ref 22–32)
CO2: 24 mmol/L (ref 22–32)
Calcium: 8.5 mg/dL — ABNORMAL LOW (ref 8.9–10.3)
Calcium: 8.5 mg/dL — ABNORMAL LOW (ref 8.9–10.3)
Chloride: 102 mmol/L (ref 98–111)
Chloride: 101 mmol/L (ref 98–111)
Creatinine, Ser: 0.83 mg/dL (ref 0.44–1.00)
Creatinine, Ser: 0.81 mg/dL (ref 0.44–1.00)
GFR, Estimated: >60 mL/min (ref >60)
GFR, Estimated: >60 mL/min (ref >60)
Glucose, Bld: 114 mg/dL — ABNORMAL HIGH (ref 70–99)
Glucose, Bld: 161 mg/dL — ABNORMAL HIGH (ref 70–99)
Potassium: 3.7 mmol/L (ref 3.5–5.1)
Potassium: 3.5 mmol/L (ref 3.5–5.1)
Sodium: 140 mmol/L (ref 135–145)
Sodium: 136 mmol/L (ref 135–145)
Total Bilirubin: 2.4 mg/dL — ABNORMAL HIGH (ref 0.3–1.2)
Total Bilirubin: 3.1 mg/dL — ABNORMAL HIGH (ref 0.3–1.2)
Total Protein: 6.1 g/dL — ABNORMAL LOW (ref 6.5–8.1)
Total Protein: 6.9 g/dL (ref 6.5–8.1)

## 2020-03-30 LAB — CBC
HCT: 37.8 % (ref 36.0–46.0)
HCT: 39.5 % (ref 36.0–46.0)
Hemoglobin: 11.7 g/dL — ABNORMAL LOW (ref 12.0–15.0)
Hemoglobin: 12.4 g/dL (ref 12.0–15.0)
MCH: 25.4 pg — ABNORMAL LOW (ref 26.0–34.0)
MCH: 25.6 pg — ABNORMAL LOW (ref 26.0–34.0)
MCHC: 31 g/dL (ref 30.0–36.0)
MCHC: 31.4 g/dL (ref 30.0–36.0)
MCV: 82.2 fL (ref 80.0–100.0)
MCV: 81.4 fL (ref 80.0–100.0)
Platelets: 181 10*3/uL (ref 150–400)
Platelets: 190 10*3/uL (ref 150–400)
RBC: 4.6 MIL/uL (ref 3.87–5.11)
RBC: 4.85 MIL/uL (ref 3.87–5.11)
RDW: 14.2 % (ref 11.5–15.5)
RDW: 14.4 % (ref 11.5–15.5)
WBC: 6.6 10*3/uL (ref 4.0–10.5)
WBC: 8.8 10*3/uL (ref 4.0–10.5)
nRBC: 0 % (ref 0.0–0.2)
nRBC: 0 % (ref 0.0–0.2)

## 2020-03-30 SURGERY — LAPAROSCOPIC CHOLECYSTECTOMY WITH INTRAOPERATIVE CHOLANGIOGRAM
Anesthesia: General

## 2020-03-30 MED ORDER — ONDANSETRON HCL 4 MG/2ML IJ SOLN
INTRAMUSCULAR | Status: AC
  Filled 2020-03-30: qty 2

## 2020-03-30 MED ORDER — POLYETHYLENE GLYCOL 3350 17 G PO PACK: 17.0000 g | PACK | Freq: Two times a day (BID) | ORAL | Status: DC | PRN

## 2020-03-30 MED ORDER — PROCHLORPERAZINE EDISYLATE 10 MG/2ML IJ SOLN: 5.0000 mg | INTRAMUSCULAR | Status: DC | PRN

## 2020-03-30 MED ORDER — PROPOFOL 10 MG/ML IV BOLUS
INTRAVENOUS | Status: DC | PRN
  Administered 2020-03-30: 200 mg via INTRAVENOUS

## 2020-03-30 MED ORDER — CLINDAMYCIN PHOSPHATE 600 MG/50ML IV SOLN
600.0000 mg | INTRAVENOUS | Status: DC
  Filled 2020-03-30: qty 50

## 2020-03-30 MED ORDER — BUPIVACAINE-EPINEPHRINE 0.25% -1:200000 IJ SOLN
INTRAMUSCULAR | Status: DC | PRN
  Administered 2020-03-30: 30 mL

## 2020-03-30 MED ORDER — DEXAMETHASONE SODIUM PHOSPHATE 10 MG/ML IJ SOLN
INTRAMUSCULAR | Status: AC
  Filled 2020-03-30: qty 1

## 2020-03-30 MED ORDER — SIMETHICONE 40 MG/0.6ML PO SUSP
40.0000 mg | Freq: Four times a day (QID) | ORAL | Status: DC | PRN
  Filled 2020-03-30: qty 0.6

## 2020-03-30 MED ORDER — LACTATED RINGERS IV SOLN: INTRAVENOUS | Status: DC | PRN

## 2020-03-30 MED ORDER — FENTANYL CITRATE (PF) 100 MCG/2ML IJ SOLN
25.0000 ug | INTRAMUSCULAR | Status: DC | PRN
  Administered 2020-03-30 (×2): 50 ug via INTRAVENOUS

## 2020-03-30 MED ORDER — HYDROCORTISONE (PERIANAL) 2.5 % EX CREA
1.0000 "application " | TOPICAL_CREAM | Freq: Four times a day (QID) | CUTANEOUS | Status: DC | PRN
  Filled 2020-03-30: qty 28.35

## 2020-03-30 MED ORDER — ROCURONIUM BROMIDE 10 MG/ML (PF) SYRINGE
PREFILLED_SYRINGE | INTRAVENOUS | Status: DC | PRN
  Administered 2020-03-30: 20 mg via INTRAVENOUS
  Administered 2020-03-30: 50 mg via INTRAVENOUS

## 2020-03-30 MED ORDER — BISACODYL 10 MG RE SUPP: 10.0000 mg | Freq: Two times a day (BID) | RECTAL | Status: DC | PRN

## 2020-03-30 MED ORDER — GENTAMICIN SULFATE 40 MG/ML IJ SOLN
5.0000 mg/kg | INTRAVENOUS | Status: AC
  Administered 2020-03-30: 490 mg via INTRAVENOUS
  Filled 2020-03-30: qty 12.25

## 2020-03-30 MED ORDER — BUPIVACAINE LIPOSOME 1.3 % IJ SUSP
20.0000 mL | Freq: Once | INTRAMUSCULAR | Status: AC
  Administered 2020-03-30: 20 mL
  Filled 2020-03-30: qty 20

## 2020-03-30 MED ORDER — KETOROLAC TROMETHAMINE 30 MG/ML IJ SOLN
INTRAMUSCULAR | Status: DC | PRN
  Administered 2020-03-30: 30 mg via INTRAVENOUS

## 2020-03-30 MED ORDER — FENTANYL CITRATE (PF) 100 MCG/2ML IJ SOLN
INTRAMUSCULAR | Status: AC
  Filled 2020-03-30: qty 2

## 2020-03-30 MED ORDER — ACETAMINOPHEN 500 MG PO TABS
1000.0000 mg | ORAL_TABLET | Freq: Three times a day (TID) | ORAL | Status: DC
  Administered 2020-03-30 – 2020-03-31 (×3): 1000 mg via ORAL
  Filled 2020-03-30 (×3): qty 2

## 2020-03-30 MED ORDER — DEXAMETHASONE SODIUM PHOSPHATE 10 MG/ML IJ SOLN
INTRAMUSCULAR | Status: DC | PRN
  Administered 2020-03-30: 10 mg via INTRAVENOUS

## 2020-03-30 MED ORDER — SODIUM CHLORIDE 0.9 % IV SOLN: 250.0000 mL | INTRAVENOUS | Status: DC | PRN

## 2020-03-30 MED ORDER — ACETAMINOPHEN 500 MG PO TABS: 1000.0000 mg | ORAL_TABLET | ORAL | Status: DC

## 2020-03-30 MED ORDER — ROCURONIUM BROMIDE 10 MG/ML (PF) SYRINGE
PREFILLED_SYRINGE | INTRAVENOUS | Status: AC
  Filled 2020-03-30: qty 10

## 2020-03-30 MED ORDER — LACTATED RINGERS IR SOLN
Status: DC | PRN
  Administered 2020-03-30: 2000 mL

## 2020-03-30 MED ORDER — LACTATED RINGERS IV BOLUS: 1000.0000 mL | Freq: Three times a day (TID) | INTRAVENOUS | Status: DC | PRN

## 2020-03-30 MED ORDER — FENTANYL CITRATE (PF) 100 MCG/2ML IJ SOLN
INTRAMUSCULAR | Status: DC | PRN
  Administered 2020-03-30 (×2): 50 ug via INTRAVENOUS
  Administered 2020-03-30: 100 ug via INTRAVENOUS
  Administered 2020-03-30: 50 ug via INTRAVENOUS

## 2020-03-30 MED ORDER — LIP MEDEX EX OINT
1.0000 "application " | TOPICAL_OINTMENT | Freq: Two times a day (BID) | CUTANEOUS | Status: DC
  Administered 2020-03-30 – 2020-03-31 (×2): 1 via TOPICAL
  Filled 2020-03-30: qty 7

## 2020-03-30 MED ORDER — PHENOL 1.4 % MT LIQD
1.0000 | OROMUCOSAL | Status: DC | PRN
  Filled 2020-03-30: qty 177

## 2020-03-30 MED ORDER — CLINDAMYCIN PHOSPHATE 900 MG/50ML IV SOLN
INTRAVENOUS | Status: AC
  Filled 2020-03-30: qty 50

## 2020-03-30 MED ORDER — SUCCINYLCHOLINE CHLORIDE 200 MG/10ML IV SOSY
PREFILLED_SYRINGE | INTRAVENOUS | Status: DC | PRN
  Administered 2020-03-30: 160 mg via INTRAVENOUS

## 2020-03-30 MED ORDER — AMISULPRIDE (ANTIEMETIC) 5 MG/2ML IV SOLN: 10.0000 mg | Freq: Once | INTRAVENOUS | Status: DC | PRN

## 2020-03-30 MED ORDER — MENTHOL 3 MG MT LOZG: 1.0000 | LOZENGE | OROMUCOSAL | Status: DC | PRN

## 2020-03-30 MED ORDER — HYDROCORTISONE 1 % EX CREA
1.0000 "application " | TOPICAL_CREAM | Freq: Three times a day (TID) | CUTANEOUS | Status: DC | PRN
  Filled 2020-03-30: qty 28

## 2020-03-30 MED ORDER — SCOPOLAMINE 1 MG/3DAYS TD PT72: 1.0000 | MEDICATED_PATCH | TRANSDERMAL | Status: DC

## 2020-03-30 MED ORDER — HYDROMORPHONE HCL 1 MG/ML IJ SOLN: 0.5000 mg | INTRAMUSCULAR | Status: DC | PRN

## 2020-03-30 MED ORDER — ONDANSETRON HCL 4 MG/2ML IJ SOLN
INTRAMUSCULAR | Status: DC | PRN
  Administered 2020-03-30: 4 mg via INTRAVENOUS

## 2020-03-30 MED ORDER — SODIUM CHLORIDE 0.9% FLUSH
3.0000 mL | Freq: Two times a day (BID) | INTRAVENOUS | Status: DC
  Administered 2020-03-30 – 2020-03-31 (×3): 3 mL via INTRAVENOUS

## 2020-03-30 MED ORDER — PROPOFOL 10 MG/ML IV BOLUS
INTRAVENOUS | Status: AC
  Filled 2020-03-30: qty 20

## 2020-03-30 MED ORDER — ENSURE PRE-SURGERY PO LIQD
296.0000 mL | Freq: Once | ORAL | Status: DC
  Filled 2020-03-30: qty 296

## 2020-03-30 MED ORDER — EPHEDRINE SULFATE-NACL 50-0.9 MG/10ML-% IV SOSY
PREFILLED_SYRINGE | INTRAVENOUS | Status: DC | PRN
  Administered 2020-03-30 (×2): 10 mg via INTRAVENOUS

## 2020-03-30 MED ORDER — GABAPENTIN 300 MG PO CAPS: 300.0000 mg | ORAL_CAPSULE | ORAL | Status: DC

## 2020-03-30 MED ORDER — ALUM & MAG HYDROXIDE-SIMETH 200-200-20 MG/5ML PO SUSP: 30.0000 mL | Freq: Four times a day (QID) | ORAL | Status: DC | PRN

## 2020-03-30 MED ORDER — GABAPENTIN 300 MG PO CAPS
300.0000 mg | ORAL_CAPSULE | Freq: Every day | ORAL | Status: DC
  Administered 2020-03-30: 300 mg via ORAL
  Filled 2020-03-30: qty 1

## 2020-03-30 MED ORDER — MAGIC MOUTHWASH
15.0000 mL | Freq: Four times a day (QID) | ORAL | Status: DC | PRN
  Filled 2020-03-30: qty 15

## 2020-03-30 MED ORDER — GENTAMICIN IN SALINE 1-0.9 MG/ML-% IV SOLN
100.0000 mg | INTRAVENOUS | Status: DC
  Filled 2020-03-30: qty 100

## 2020-03-30 MED ORDER — MIDAZOLAM HCL 5 MG/5ML IJ SOLN
INTRAMUSCULAR | Status: DC | PRN
  Administered 2020-03-30: 2 mg via INTRAVENOUS

## 2020-03-30 MED ORDER — BUPIVACAINE-EPINEPHRINE (PF) 0.25% -1:200000 IJ SOLN
INTRAMUSCULAR | Status: AC
  Filled 2020-03-30: qty 60

## 2020-03-30 MED ORDER — CLINDAMYCIN PHOSPHATE 900 MG/50ML IV SOLN
900.0000 mg | INTRAVENOUS | Status: AC
  Administered 2020-03-30: 900 mg via INTRAVENOUS

## 2020-03-30 MED ORDER — GUAIFENESIN-DM 100-10 MG/5ML PO SYRP: 10.0000 mL | ORAL_SOLUTION | ORAL | Status: DC | PRN

## 2020-03-30 MED ORDER — SODIUM CHLORIDE 0.9 % IR SOLN
Status: DC | PRN
  Administered 2020-03-30: 1000 mL

## 2020-03-30 MED ORDER — FENTANYL CITRATE (PF) 250 MCG/5ML IJ SOLN
INTRAMUSCULAR | Status: AC
  Filled 2020-03-30: qty 5

## 2020-03-30 MED ORDER — METHOCARBAMOL 1000 MG/10ML IJ SOLN
1000.0000 mg | Freq: Four times a day (QID) | INTRAVENOUS | Status: DC | PRN
  Filled 2020-03-30: qty 10

## 2020-03-30 MED ORDER — SUGAMMADEX SODIUM 200 MG/2ML IV SOLN
INTRAVENOUS | Status: DC | PRN
  Administered 2020-03-30: 200 mg via INTRAVENOUS

## 2020-03-30 MED ORDER — MIDAZOLAM HCL 2 MG/2ML IJ SOLN
INTRAMUSCULAR | Status: AC
  Filled 2020-03-30: qty 2

## 2020-03-30 MED ORDER — IOHEXOL 300 MG/ML  SOLN
INTRAMUSCULAR | Status: DC | PRN
  Administered 2020-03-30: 4 mL

## 2020-03-30 MED ORDER — SODIUM CHLORIDE 0.9% FLUSH: 3.0000 mL | INTRAVENOUS | Status: DC | PRN

## 2020-03-30 SURGICAL SUPPLY — 41 items
APPLIER CLIP 5 13 M/L LIGAMAX5 (MISCELLANEOUS)
APPLIER CLIP ROT 10 11.4 M/L (STAPLE)
CABLE HIGH FREQUENCY MONO STRZ (ELECTRODE) IMPLANT
CLIP APPLIE 5 13 M/L LIGAMAX5 (MISCELLANEOUS) IMPLANT
CLIP APPLIE ROT 10 11.4 M/L (STAPLE) IMPLANT
COVER MAYO STAND STRL (DRAPES) ×2 IMPLANT
COVER SURGICAL LIGHT HANDLE (MISCELLANEOUS) ×2 IMPLANT
COVER WAND RF STERILE (DRAPES) ×2 IMPLANT
DECANTER SPIKE VIAL GLASS SM (MISCELLANEOUS) ×2 IMPLANT
DRAPE C-ARM 42X120 X-RAY (DRAPES) ×2 IMPLANT
DRAPE UTILITY XL STRL (DRAPES) ×2 IMPLANT
DRAPE WARM FLUID 44X44 (DRAPES) ×2 IMPLANT
DRSG TEGADERM 2-3/8X2-3/4 SM (GAUZE/BANDAGES/DRESSINGS) ×6 IMPLANT
DRSG TEGADERM 4X4.75 (GAUZE/BANDAGES/DRESSINGS) ×1 IMPLANT
ELECT REM PT RETURN 15FT ADLT (MISCELLANEOUS) ×2 IMPLANT
ENDOLOOP SUT PDS II  0 18 (SUTURE) ×1
ENDOLOOP SUT PDS II 0 18 (SUTURE) IMPLANT
GLOVE ECLIPSE 8.0 STRL XLNG CF (GLOVE) ×2 IMPLANT
GLOVE INDICATOR 8.0 STRL GRN (GLOVE) ×2 IMPLANT
GOWN STRL REUS W/TWL XL LVL3 (GOWN DISPOSABLE) ×4 IMPLANT
IRRIG SUCT STRYKERFLOW 2 WTIP (MISCELLANEOUS) ×2
IRRIGATION SUCT STRKRFLW 2 WTP (MISCELLANEOUS) ×1 IMPLANT
KIT BASIN OR (CUSTOM PROCEDURE TRAY) ×2 IMPLANT
KIT TURNOVER KIT A (KITS) IMPLANT
PENCIL SMOKE EVACUATOR (MISCELLANEOUS) IMPLANT
POUCH RETRIEVAL ECOSAC 10 (ENDOMECHANICALS) ×1 IMPLANT
POUCH RETRIEVAL ECOSAC 10MM (ENDOMECHANICALS) ×1
SCISSORS LAP 5X35 DISP (ENDOMECHANICALS) ×2 IMPLANT
SET CHOLANGIOGRAPH MIX (MISCELLANEOUS) ×2 IMPLANT
SET TUBE SMOKE EVAC HIGH FLOW (TUBING) ×2 IMPLANT
SLEEVE XCEL OPT CAN 5 100 (ENDOMECHANICALS) IMPLANT
SPONGE GAUZE 2X2 8PLY STRL LF (GAUZE/BANDAGES/DRESSINGS) ×1 IMPLANT
SUT MNCRL AB 4-0 PS2 18 (SUTURE) ×2 IMPLANT
SYR 20ML LL LF (SYRINGE) ×2 IMPLANT
TOWEL OR 17X26 10 PK STRL BLUE (TOWEL DISPOSABLE) ×2 IMPLANT
TOWEL OR NON WOVEN STRL DISP B (DISPOSABLE) ×2 IMPLANT
TRAY LAPAROSCOPIC (CUSTOM PROCEDURE TRAY) ×2 IMPLANT
TROCAR 12M 150ML BLUNT (TROCAR) ×1 IMPLANT
TROCAR BLADELESS OPT 5 100 (ENDOMECHANICALS) ×2 IMPLANT
TROCAR BLADELESS OPT 5 150 (ENDOMECHANICALS) ×3 IMPLANT
TROCAR XCEL NON-BLD 11X100MML (ENDOMECHANICALS) ×2 IMPLANT

## 2020-03-30 NOTE — Progress Notes (Signed)
PROGRESS NOTE    Brittany Esparza  KVQ:[id number] DOB: 08/14/1980 DOA: 03/28/2020 PCP: Alfonso Patten, MD    Brief Narrative:  39 y.o. female with medical history significant of obesity, hypothyroidism, GERD, depression who presents with biliary obstruction as a transfer from urgent care. Found to have cholelithiasis on imaging. Initial abd [place] with dilated CBD of 65mm with subsequent MRCP being neg for obstructing stone  Assessment & Plan:   Principal Problem:   Acute calculous cholecystitis s/p lap cholecystectomy 03/30/2020 Active Problems:   Biliary obstruction   Morbid obesity with BMI of 60.0-69.9, adult (HCC)   Depressive disorder   Hypothyroidism   Gastro-esophageal reflux disease without esophagitis   Steatohepatitis, nonalcoholic  Biliary obstruction Cholelithiasis Initial concerns of dilated common bile duct with cholelithiasis on abd [place] F/u MRCP without obstructing stone noted, suspicion for passed stone GI consulted who recommended surgical consult General Surgery is folloowing Pt is now s/p surgery as of 12/18  Hypothyroidism Continue with synthroid as pt tolerates  Depression  Continued on home sertraline   GERD  On Protonix prior to admit  DVT prophylaxis: SCD's Code Status: Full Family Communication: Pt in room, family not at bedside  Status is: Observation  The patient remains OBS appropriate and will d/c before 2 midnights.  Dispo: The patient is from: Home              Anticipated d/c is to: Home              Anticipated d/c date is: 2 days              Patient currently is not medically stable to d/c.   Consultants:   GI  General Surgery  Procedures:   Lap chole 12/18  Antimicrobials: Anti-infectives (From admission, onward)   Start     Dose/Rate Route Frequency Ordered Stop   03/30/20 0815  clindamycin (CLEOCIN) IVPB 900 mg       Note to Pharmacy: Pharmacy may adjust dosing strength, interval, or rate of  medication as needed for optimal therapy for the patient Send with patient on call to the OR.  Anesthesia to complete antibiotic administration <63min prior to incision per [facility].   900 mg 100 mL/hr over 30 Minutes Intravenous On call to O.R. 03/30/20 6433 03/30/20 0823   03/30/20 0815  gentamicin (GARAMYCIN) 490 mg in dextrose 5 % 100 mL IVPB        5 mg/kg  98.1 kg (Adjusted) 112.3 mL/hr over 60 Minutes Intravenous On call to O.R. 03/30/20 0810 03/30/20 0841   03/30/20 0801  clindamycin (CLEOCIN) 900 MG/50ML IVPB       Note to Pharmacy: Myrlene Broker   : cabinet override      03/30/20 0801 03/30/20 0808   03/30/20 0800  clindamycin (CLEOCIN) IVPB 600 mg  Status:  Discontinued       Note to Pharmacy: Pharmacy may adjust dosing strength, interval, or rate of medication as needed for optimal therapy for the patient Send with patient on call to the OR.  Anesthesia to complete antibiotic administration <6min prior to incision per [facility].   600 mg 100 mL/hr over 30 Minutes Intravenous On call to O.R. 03/30/20 0756 03/30/20 0806   03/30/20 0800  gentamicin (GARAMYCIN) IVPB 100 mg  Status:  Discontinued       Note to Pharmacy: Pharmacy may adjust dosing strength, schedule, rate of infusion, etc as needed to optimize therapy Send with patient on call to the  OR.  Anesthesia to complete antibiotic administration <38min prior to incision per [facility].   100 mg 200 mL/hr over 30 Minutes Intravenous On call to O.R. 03/30/20 0756 03/30/20 0807      Subjective: Without complaints. Feels well following surgery  Objective: Vitals:   03/30/20 1030 03/30/20 1050 03/30/20 1113 03/30/20 1329  BP: 117/60 126/80 121/79 124/73  Pulse: 67 67 69 75  Resp: 15 11 20 18   Temp:   97.7 F (36.5 C) 98 F (36.7 C)  TempSrc:   Oral Oral  SpO2: 95% 98% 96% 98%  Weight:      Height:        Intake/Output Summary (Last 24 hours) at 03/30/2020 1633 Last data filed at 03/30/2020 1400 Gross  per 24 hour  Intake 2172.25 ml  Output 20 ml  Net 2152.25 ml   Filed Weights   03/28/20 1520  Weight: (!) 159.7 kg    Examination: General exam: Conversant, in no acute distress Respiratory system: normal chest rise, clear, no audible wheezing Cardiovascular system: regular rhythm, s1-s2 Gastrointestinal system: Nondistended, nontender, pos BS Central nervous system: No seizures, no tremors Extremities: No cyanosis, no joint deformities Skin: No rashes, no pallor Psychiatry: Affect normal // no auditory hallucinations   Data Reviewed: I have personally reviewed following labs and imaging studies  CBC: Recent Labs  Lab 03/28/20 1532 03/30/20 0343 03/30/20 1047  WBC 7.5 6.6 8.8  HGB 13.5 11.7* 12.4  HCT 41.7 37.8 39.5  MCV 79.4* 82.2 81.4  PLT 205 181 190   Basic Metabolic Panel: Recent Labs  Lab 03/28/20 1532 03/29/20 0419 03/30/20 0343 03/30/20 1047  NA 136 138 140 136  K 3.7 3.6 3.7 3.5  CL 103 103 102 101  CO2 23 25 28 24   GLUCOSE 97 103* 114* 161*  BUN 8 8 9 9   CREATININE 0.68 0.77 0.83 0.81  CALCIUM 8.6* 8.4* 8.5* 8.5*   GFR: Estimated Creatinine Clearance: 147.3 mL/min (by C-G formula based on SCr of 0.81 mg/dL). Liver Function Tests: Recent Labs  Lab 03/28/20 1532 03/29/20 0419 03/30/20 0343 03/30/20 1047  AST 176* 107* 159* 234*  ALT 149* 116* 147* 207*  ALKPHOS 175* 152* 195* 223*  BILITOT 3.0* 1.4* 2.4* 3.1*  PROT 7.2 6.3* 6.1* 6.9  ALBUMIN 3.8 3.3* 3.1* 3.4*   Recent Labs  Lab 03/28/20 1532  LIPASE 23   No results for input(s): AMMONIA in the last 168 hours. Coagulation Profile: No results for input(s): INR, PROTIME in the last 168 hours. Cardiac Enzymes: No results for input(s): CKTOTAL, CKMB, CKMBINDEX, TROPONINI in the last 168 hours. BNP (last 3 results) No results for input(s): PROBNP in the last 8760 hours. HbA1C: No results for input(s): HGBA1C in the last 72 hours. CBG: No results for input(s): GLUCAP in the last 168  hours. Lipid Profile: No results for input(s): CHOL, HDL, LDLCALC, TRIG, CHOLHDL, LDLDIRECT in the last 72 hours. Thyroid Function Tests: No results for input(s): TSH, T4TOTAL, FREET4, T3FREE, THYROIDAB in the last 72 hours. Anemia Panel: No results for input(s): VITAMINB12, FOLATE, FERRITIN, TIBC, IRON, RETICCTPCT in the last 72 hours. Sepsis Labs: No results for input(s): PROCALCITON, LATICACIDVEN in the last 168 hours.  Recent Results (from the past 240 hour(s))  Resp Panel by RT-PCR (Flu A&B, Covid) Nasopharyngeal Swab     Status: None   Collection Time: 03/28/20  5:51 PM   Specimen: Nasopharyngeal Swab; Nasopharyngeal(NP) swabs in vial transport medium  Result Value Ref Range Status  SARS Coronavirus 2 by RT PCR NEGATIVE NEGATIVE Final    Comment: (NOTE) SARS-CoV-2 target nucleic acids are NOT DETECTED.  The SARS-CoV-2 RNA is generally detectable in upper respiratory specimens during the acute phase of infection. The lowest concentration of SARS-CoV-2 viral copies this assay can detect is 138 copies/mL. A negative result does not preclude SARS-Cov-2 infection and should not be used as the sole basis for treatment or other patient management decisions. A negative result may occur with  improper specimen collection/handling, submission of specimen other than nasopharyngeal swab, presence of viral mutation(s) within the areas targeted by this assay, and inadequate number of viral copies(<138 copies/mL). A negative result must be combined with clinical observations, patient history, and epidemiological information. The expected result is Negative.  Fact Sheet for Patients:  BloggerCourse.comhttps://www.fda.gov/media/152166/download  Fact Sheet for Healthcare Providers:  SeriousBroker.ithttps://www.fda.gov/media/152162/download  This test is no t yet approved or cleared by the [place] FDA and  has been authorized for detection and/or diagnosis of SARS-CoV-2 by FDA under an Emergency Use Authorization  (EUA). This EUA will remain  in effect (meaning this test can be used) for the duration of the COVID-19 declaration under Section 564(b)(1) of the Act, 21 U.S.C.section 360bbb-3(b)(1), unless the authorization is terminated  or revoked sooner.       Influenza A by PCR NEGATIVE NEGATIVE Final   Influenza B by PCR NEGATIVE NEGATIVE Final    Comment: (NOTE) The Xpert Xpress SARS-CoV-2/FLU/RSV plus assay is intended as an aid in the diagnosis of influenza from Nasopharyngeal swab specimens and should not be used as a sole basis for treatment. Nasal washings and aspirates are unacceptable for Xpert Xpress SARS-CoV-2/FLU/RSV testing.  Fact Sheet for Patients: BloggerCourse.comhttps://www.fda.gov/media/152166/download  Fact Sheet for Healthcare Providers: SeriousBroker.ithttps://www.fda.gov/media/152162/download  This test is not yet approved or cleared by the [place] FDA and has been authorized for detection and/or diagnosis of SARS-CoV-2 by FDA under an Emergency Use Authorization (EUA). This EUA will remain in effect (meaning this test can be used) for the duration of the COVID-19 declaration under Section 564(b)(1) of the Act, 21 U.S.C. section 360bbb-3(b)(1), unless the authorization is terminated or revoked.  Performed at [facility] [place], [address] [place], [place] [address]      Radiology Studies: DG Cholangiogram Operative  Result Date: 03/30/2020 CLINICAL DATA:  Cholecystectomy for symptomatic cholelithiasis. EXAM: INTRAOPERATIVE CHOLANGIOGRAM TECHNIQUE: Cholangiographic images from the C-arm fluoroscopic device were submitted for interpretation post-operatively. Please see the procedural report for the amount of contrast and the fluoroscopy time utilized. COMPARISON:  None. FINDINGS: Intraoperative imaging with a C-arm demonstrates normal opacified bile ducts without evidence of filling defect or obstruction. Contrast enters the duodenum normally. IMPRESSION: Normal intraoperative  cholangiogram. Electronically Signed   By: Irish LackGlenn  Yamagata M.D.   On: 03/30/2020 09:12   MR 3D Recon At Scanner  Result Date: 03/29/2020 CLINICAL DATA:  39 year old female with history of right upper quadrant abdominal pain. Possible common bile duct dilatation noted on recent ultrasound examination. EXAM: MRI ABDOMEN WITHOUT AND WITH CONTRAST (INCLUDING MRCP) TECHNIQUE: Multiplanar multisequence MR imaging of the abdomen was performed both before and after the administration of intravenous contrast. Heavily T2-weighted images of the biliary and pancreatic ducts were obtained, and three-dimensional MRCP images were rendered by post processing. CONTRAST:  10mL GADAVIST GADOBUTROL 1 MMOL/ML IV SOLN COMPARISON:  No prior abdominal MRI. Abdominal ultrasound 03/28/2020. FINDINGS: Lower chest: Unremarkable. Hepatobiliary: Liver is enlarged measuring up to 23.2 cm in craniocaudal span. Severe diffuse loss of  signal intensity throughout the hepatic parenchyma on out of phase dual echo images, indicative of a background of severe hepatic steatosis. No suspicious cystic or solid hepatic lesions. No intra or extrahepatic biliary ductal dilatation noted on MRCP images. Common bile duct measures up to 5 mm in the porta hepatis. No filling defect within the common bile duct to suggest choledocholithiasis. Multiple filling defects are noted within the gallbladder lying dependently, compatible with small gallstones. Gallbladder is not distended. Gallbladder wall thickness is normal. No pericholecystic fluid or surrounding inflammatory changes. Pancreas: No pancreatic mass. No pancreatic ductal dilatation noted on MRCP images. No pancreatic or peripancreatic fluid collections or inflammatory changes. Spleen:  Unremarkable. Adrenals/Urinary Tract: Subcentimeter T1 hypointense, T2 hyperintense, nonenhancing lesion in the lower pole of the left kidney, compatible with a simple cyst. Right kidney and bilateral adrenal glands are  normal in appearance. No hydroureteronephrosis in the visualized portions of the abdomen. Stomach/Bowel: Visualized portions are unremarkable. Vascular/Lymphatic: No aneurysm. No lymphadenopathy identified in the visualized abdominal vasculature noted in the visualized abdomen. Other: No significant volume of ascites noted in the visualized portions of the peritoneal cavity. Musculoskeletal: No aggressive appearing osseous lesions noted in the visualized portions of the skeleton. IMPRESSION: 1. Study is positive for cholelithiasis. However, there is no evidence of acute cholecystitis. Additionally, there is no evidence of choledocholithiasis or biliary tract obstruction. 2. Hepatomegaly with severe hepatic steatosis. Electronically Signed   By: Trudie Reed M.D.   On: 03/29/2020 07:13   MR ABDOMEN MRCP W WO CONTAST  Result Date: 03/29/2020 CLINICAL DATA:  39 year old female with history of right upper quadrant abdominal pain. Possible common bile duct dilatation noted on recent ultrasound examination. EXAM: MRI ABDOMEN WITHOUT AND WITH CONTRAST (INCLUDING MRCP) TECHNIQUE: Multiplanar multisequence MR imaging of the abdomen was performed both before and after the administration of intravenous contrast. Heavily T2-weighted images of the biliary and pancreatic ducts were obtained, and three-dimensional MRCP images were rendered by post processing. CONTRAST:  10mL GADAVIST GADOBUTROL 1 MMOL/ML IV SOLN COMPARISON:  No prior abdominal MRI. Abdominal ultrasound 03/28/2020. FINDINGS: Lower chest: Unremarkable. Hepatobiliary: Liver is enlarged measuring up to 23.2 cm in craniocaudal span. Severe diffuse loss of signal intensity throughout the hepatic parenchyma on out of phase dual echo images, indicative of a background of severe hepatic steatosis. No suspicious cystic or solid hepatic lesions. No intra or extrahepatic biliary ductal dilatation noted on MRCP images. Common bile duct measures up to 5 mm in the porta  hepatis. No filling defect within the common bile duct to suggest choledocholithiasis. Multiple filling defects are noted within the gallbladder lying dependently, compatible with small gallstones. Gallbladder is not distended. Gallbladder wall thickness is normal. No pericholecystic fluid or surrounding inflammatory changes. Pancreas: No pancreatic mass. No pancreatic ductal dilatation noted on MRCP images. No pancreatic or peripancreatic fluid collections or inflammatory changes. Spleen:  Unremarkable. Adrenals/Urinary Tract: Subcentimeter T1 hypointense, T2 hyperintense, nonenhancing lesion in the lower pole of the left kidney, compatible with a simple cyst. Right kidney and bilateral adrenal glands are normal in appearance. No hydroureteronephrosis in the visualized portions of the abdomen. Stomach/Bowel: Visualized portions are unremarkable. Vascular/Lymphatic: No aneurysm. No lymphadenopathy identified in the visualized abdominal vasculature noted in the visualized abdomen. Other: No significant volume of ascites noted in the visualized portions of the peritoneal cavity. Musculoskeletal: No aggressive appearing osseous lesions noted in the visualized portions of the skeleton. IMPRESSION: 1. Study is positive for cholelithiasis. However, there is no evidence of acute cholecystitis. Additionally, there  is no evidence of choledocholithiasis or biliary tract obstruction. 2. Hepatomegaly with severe hepatic steatosis. Electronically Signed   By: Trudie Reed M.D.   On: 03/29/2020 07:13    Scheduled Meds: . acetaminophen  1,000 mg Oral TID  . fentaNYL      . gabapentin  300 mg Oral QHS  . levothyroxine  224 mcg Oral Q0600  . lip balm  1 application Topical BID  . multivitamin with minerals  1 tablet Oral Daily  . pantoprazole  40 mg Oral Daily  . sertraline  150 mg Oral Daily  . sodium chloride flush  3 mL Intravenous Q12H   Continuous Infusions: . sodium chloride    . lactated ringers    .  methocarbamol (ROBAXIN) IV       LOS: 1 day   Rickey Barbara, MD [facility] Pager On Amion  If 7PM-7AM, please contact night-coverage 03/30/2020, 4:33 PM

## 2020-03-30 NOTE — Op Note (Signed)
03/30/2020  PATIENT:  Brittany Esparza  39 y.o. female  Patient Care Team: Skidmore, Harvie Junior, MD as PCP - General (Family Medicine)  PRE-OPERATIVE DIAGNOSIS:    Acute on Chronic Calculus Cholecystitis  POST-OPERATIVE DIAGNOSIS:   Acute on Chronic Calculus Cholecystitis Liver: Fatty steatohepatitis  PROCEDURE:  Core Liver Biopsy & Laparoscopic cholecystectomy with intraoperative cholangiogram  SURGEON:  Ardeth Sportsman, MD, FACS.  ASSISTANT: OR Staff   ANESTHESIA:    General with endotracheal intubation Local anesthetic as a field block  EBL:  (See Anesthesia Intraoperative Record) Total I/O In: 1762.3 [I.V.:1600; IV Piggyback:162.3] Out: 20 [Blood:20]   Delay start of Pharmacological VTE agent (>24hrs) due to surgical blood loss or risk of bleeding:  no  DRAINS: None   SPECIMEN: Gallbladder & Core liver biopsies    DISPOSITION OF SPECIMEN:  PATHOLOGY  COUNTS:  YES  PLAN OF CARE: Admit to inpatient   PATIENT DISPOSITION:  PACU - hemodynamically stable.  INDICATION: Patient with morbid obesity and nausea vomiting biliary colic persisting concerning for cholecystitis. Elevated liver function test. Recommended laparoscopic cholecystectomy with cholangiogram and possible liver biopsy.  The anatomy & physiology of hepatobiliary & pancreatic function was discussed.  The pathophysiology of gallbladder dysfunction was discussed.  Natural history risks without surgery was discussed.   I feel the risks of no intervention will lead to serious problems that outweigh the operative risks; therefore, I recommended cholecystectomy to remove the pathology.  I explained laparoscopic techniques with possible need for an open approach.  Probable cholangiogram to evaluate the bilary tract was explained as well.    Risks such as bleeding, infection, abscess, leak, injury to other organs, need for further treatment, heart attack, death, and other risks were discussed.  I noted a  good likelihood this will help address the problem.  Possibility that this will not correct all abdominal symptoms was explained.  Goals of post-operative recovery were discussed as well.  We will work to minimize complications.  An educational handout further explaining the pathology and treatment options was given as well.  Questions were answered.  The patient expresses understanding & wishes to proceed with surgery.  OR FINDINGS: Turgid dilated gallbladder consistent with acute cholecystitis. Some adhesions to it chronically suspicious for chronic cholecystitis as well.  Cholangiogram showing mildly dilated biliary system but no evidence of any distal common bile duct obstruction. Perhaps some mild sludge but flushed out easily into the duodenum.  Liver: Fatty steatohepatitis -thickened and stiff. 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 Liver biopsies done.  DESCRIPTION:   Informed consent was confirmed.  The patient underwent general anaesthesia without difficulty.  The patient was positioned appropriately.  VTE prevention in place.  The patient's abdomen was clipped, prepped, & draped in a sterile fashion.  Surgical timeout confirmed our plan.  Peritoneal entry with a laparoscopic port was obtained using optical entry technique in the right upper abdomen as the patient was positioned in reverse Trendelenburg.  Entry was clean.  I induced carbon dioxide insufflation.  Camera inspection revealed no injury.  Extra ports were carefully placed under direct laparoscopic visualization -we focused on long bariatric ports in the right upper quadrant given her morbid obesity..  I turned attention to the right upper quadrant. Gallbladder was very distended with some edema consistent with acute cholecystitis. I did needle aspiration to help decompress it.  The gallbladder fundus was elevated cephalad. I used hook cautery to free the peritoneal coverings between the gallbladder and the liver on the  posteriolateral and  anteriomedial walls. I had to alternate between dome down as well as lateral mobilization of the gallbladder since the liver was enlarged and stiff. Eventually got enough mobility of the gallbladder to get down to the infundibulum. I used careful blunt and hook dissection to help get a good critical view of the cystic artery and cystic duct. I did further dissection to free 80%of the gallbladder off the liver bed to get a good critical view of the infundibulum and cystic duct.  I dissected out the cystic artery; and, after getting a good 360 view, ligated the anterior & posterior branches of the cystic artery close on the infundibulum using the Harmonic ultrasonic dissection.  I skeletonized the cystic duct.  I placed a clip on the infundibulum. I did a partial cystic duct-otomy and ensured patency. I placed a 5 [place] cholangiocatheter through a puncture site at the right subcostal ridge of the abdominal wall and directed it into the cystic duct.  We ran a cholangiogram with dilute radio-opaque contrast and continuous fluoroscopy. Contrast flowed from a side branch consistent with cystic duct cannulization. Contrast flowed up the common hepatic duct into the right and left intrahepatic chains out to secondary radicals. Quickly flush down the common bile duct after a mild pause flush into the duodenum easily. Contrast flowed down the common bile duct easily across the normal ampulla into the duodenum.  This was consistent with a normal cholangiogram.  I removed the cholangiocatheter. I placed clips on the cystic duct x4.  I completed cystic duct transection. I did an additional ligation cystic duct using 0 PDS Endoloop. Gallbladder was somewhat ischemic/necrotic on the back wall so there was spillage of numerous small 5 mm cuboids stones. I was able to get the gallbladder inside an EcoSac & place spilled stones into the sac. I removed the gallbladder out the epigastric port site.    I did  copious irrigation. I did meticulous inspection and was able to use a stones keeper to remove the remaining spilled stones. Irrigated another liter to to make sure there is nothing left over. Her liver was quite fatty and stiff so I did do core liver biopsies with a 14-gauge Tru-Cut through the anterior right hepatic lobe. Try to make sure there were no other etiologies for elevated liver function test got three excellent cores. I assured hemostasis. Hemostasis good on the gallbladder fossa of the liver. I ensured hemostasis on the gallbladder fossa of the liver and elsewhere. I inspected the rest of the abdomen & detected no injury nor bleeding elsewhere.  I closed the subxiphoid fascia transversely using #1 PDS interrupted stitches. I closed the skin using 4-0 monocryl stitch.  Sterile dressings were applied. The patient was extubated & arrived in the PACU in stable condition..  I had discussed postoperative care with the patient in the holding area. I discussed operative findings, updated the patient's status, discussed probable steps to recovery, and gave postoperative recommendations to the patient's spouse, Ivin Booty.   Recommendations were made.  Questions were answered.  He expressed understanding & appreciation.  Ardeth Sportsman, M.D., F.A.C.S. Gastrointestinal and Minimally Invasive Surgery [facility] [address] [place], [place] [address] [phone] Main / Paging  03/30/2020 9:35 AM

## 2020-03-30 NOTE — Progress Notes (Signed)
Vs stable except x 1 episode of abd pain and c/o nausea. On call  [place] NP made aware new order initiated + effectiveness. Pt kept npo. Will   Cont. Monitor pt condition.

## 2020-03-30 NOTE — Progress Notes (Signed)
Pt has just returned from PACU to Room 1540. Alert and oriented and requesting pain medication. Per PACU nurse, pt goes out and sats drop with IV pain meds. Will assess other meds ordered and give to pt.

## 2020-03-30 NOTE — Transfer of Care (Signed)
Immediate Anesthesia Transfer of Care Note  Patient: Brittany Esparza  Procedure(s) Performed: Procedure(s): LAPAROSCOPIC CHOLECYSTECTOMY WITH INTRAOPERATIVE CHOLANGIOGRAM, AND LIVER BIOPSY (N/A)  Patient Location: PACU  Anesthesia Type:General  Level of Consciousness: Alert, Awake, Oriented  Airway & Oxygen Therapy: Patient Spontanous Breathing  Post-op Assessment: Report given to RN  Post vital signs: Reviewed and stable  Last Vitals:  Vitals:   03/29/20 2030 03/30/20 0459  BP: 117/70 123/68  Pulse: 63 65  Resp: 19 19  Temp: 36.8 C 36.8 C  SpO2: 99% 96%    Complications: No apparent anesthesia complications

## 2020-03-30 NOTE — Anesthesia Preprocedure Evaluation (Signed)
Anesthesia Evaluation  Patient identified by MRN, date of birth, ID band Patient awake    Reviewed: Allergy & Precautions, NPO status , Patient's Chart, lab work & pertinent test results  Airway Mallampati: II  TM Distance: >3 FB Neck ROM: Full    Dental  (+) Dental Advisory Given   Pulmonary neg pulmonary ROS,    breath sounds clear to auscultation       Cardiovascular negative cardio ROS   Rhythm:Regular Rate:Normal     Neuro/Psych negative neurological ROS     GI/Hepatic Neg liver ROS, GERD  ,  Endo/Other  Hypothyroidism Morbid obesity  Renal/GU negative Renal ROS     Musculoskeletal   Abdominal   Peds  Hematology  (+) anemia ,   Anesthesia Other Findings   Reproductive/Obstetrics                             Anesthesia Physical Anesthesia Plan  ASA: III  Anesthesia Plan: General   Post-op Pain Management:    Induction: Intravenous  PONV Risk Score and Plan: 3 and Dexamethasone, Ondansetron and Treatment may vary due to age or medical condition  Airway Management Planned: Oral ETT  Additional Equipment: None  Intra-op Plan:   Post-operative Plan: Extubation in OR  Informed Consent: I have reviewed the patients History and Physical, chart, labs and discussed the procedure including the risks, benefits and alternatives for the proposed anesthesia with the patient or authorized representative who has indicated his/her understanding and acceptance.     Dental advisory given  Plan Discussed with: CRNA  Anesthesia Plan Comments:         Anesthesia Quick Evaluation

## 2020-03-30 NOTE — Anesthesia Procedure Notes (Signed)
Procedure Name: Intubation Date/Time: 03/30/2020 7:41 AM Performed by: Gerald Leitz, CRNA Pre-anesthesia Checklist: Patient identified, Patient being monitored, Timeout performed, Emergency Drugs available and Suction available Patient Re-evaluated:Patient Re-evaluated prior to induction Oxygen Delivery Method: Circle system utilized Preoxygenation: Pre-oxygenation with 100% oxygen Induction Type: IV induction Ventilation: Mask ventilation without difficulty Laryngoscope Size: Mac and 3 Grade View: Grade I Tube type: Oral Tube size: 7.0 mm Number of attempts: 1 Placement Confirmation: ETT inserted through vocal cords under direct vision,  positive ETCO2 and breath sounds checked- equal and bilateral Secured at: 22 cm Tube secured with: Tape Dental Injury: Teeth and Oropharynx as per pre-operative assessment

## 2020-03-30 NOTE — Discharge Instructions (Signed)
LAPAROSCOPIC SURGERY: POST OP INSTRUCTIONS  ######################################################################  EAT Gradually transition to a high fiber diet with a fiber supplement over the next few weeks after discharge.  Start with a pureed / full liquid diet (see below)  WALK Walk an hour a day.  Control your pain to do that.    CONTROL PAIN Control pain so that you can walk, sleep, tolerate sneezing/coughing, go up/down stairs.  HAVE A BOWEL MOVEMENT DAILY Keep your bowels regular to avoid problems.  OK to try a laxative to override constipation.  OK to use an antidairrheal to slow down diarrhea.  Call if not better after 2 tries  CALL IF YOU HAVE PROBLEMS/CONCERNS Call if you are still struggling despite following these instructions. Call if you have concerns not answered by these instructions  ######################################################################    1. DIET: Follow a light bland diet & liquids the first 24 hours after arrival home, such as soup, liquids, starches, etc.  Be sure to drink plenty of fluids.  Quickly advance to a usual solid diet within a few days.  Avoid fast food or heavy meals as your are more likely to get nauseated or have irregular bowels.  A low-fat, high-fiber diet for the rest of your life is ideal.  2. Take your usually prescribed home medications unless otherwise directed.  3. PAIN CONTROL: a. Pain is best controlled by a usual combination of three different methods TOGETHER: i. Ice/Heat ii. Over the counter pain medication iii. Prescription pain medication b. Most patients will experience some swelling and bruising around the incisions.  Ice packs or heating pads (30-60 minutes up to 6 times a day) will help. Use ice for the first few days to help decrease swelling and bruising, then switch to heat to help relax tight/sore spots and speed recovery.  Some people prefer to use ice alone, heat alone, alternating between ice & heat.   Experiment to what works for you.  Swelling and bruising can take several weeks to resolve.   c. It is helpful to take an over-the-counter pain medication regularly for the first few weeks.  Choose one of the following that works best for you: i. Naproxen (Aleve, etc)  Two 220mg tabs twice a day ii. Ibuprofen (Advil, etc) Three 200mg tabs four times a day (every meal & bedtime) iii. Acetaminophen (Tylenol, etc) 500-650mg four times a day (every meal & bedtime) d. A  prescription for pain medication (such as oxycodone, hydrocodone, tramadol, gabapentin, methocarbamol, etc) should be given to you upon discharge.  Take your pain medication as prescribed.  i. If you are having problems/concerns with the prescription medicine (does not control pain, nausea, vomiting, rash, itching, etc), please call us [phone] to see if we need to switch you to a different pain medicine that will work better for you and/or control your side effect better. ii. If you need a refill on your pain medication, please give us 48 hour notice.  contact your pharmacy.  They will contact our office to request authorization. Prescriptions will not be filled after 5 pm or on week-ends  4. Avoid getting constipated.   a. Between the surgery and the pain medications, it is common to experience some constipation.   b. Increasing fluid intake and taking a fiber supplement (such as Metamucil, Citrucel, FiberCon, MiraLax, etc) 1-2 times a day regularly will usually help prevent this problem from occurring.   c. A mild laxative (prune juice, Milk of Magnesia, MiraLax, etc) should be taken according to   package directions if there are no bowel movements after 48 hours.   5. Watch out for diarrhea.   a. If you have many loose bowel movements, simplify your diet to bland foods & liquids for a few days.   b. Stop any stool softeners and decrease your fiber supplement.   c. Switching to mild anti-diarrheal medications (Kayopectate, Pepto  Bismol) can help.   d. If this worsens or does not improve, please call us.  6. Wash / shower every day.  You may shower over the dressings as they are waterproof.  Continue to shower over incision(s) after the dressing is off.  7. Remove your waterproof bandages 3 days after surgery.  You may leave the incision open to air.  You may replace a dressing/Band-Aid to cover the incision for comfort if you wish.   8. ACTIVITIES as tolerated:   i. You may resume regular (light) daily activities beginning the next day--such as daily self-care, walking, climbing stairs--gradually increasing activities as tolerated.  If you can walk 30 minutes without difficulty, it is safe to try more intense activity such as jogging, treadmill, bicycling, low-impact aerobics, swimming, etc. ii. Save the most intensive and strenuous activity for last such as sit-ups, heavy lifting, contact sports, etc  Refrain from any heavy lifting or straining until you are off narcotics for pain control.   iii. DO NOT PUSH THROUGH PAIN.  Let pain be your guide: If it hurts to do something, don't do it.  Pain is your body warning you to avoid that activity for another week until the pain goes down. iv. You may drive when you are no longer taking prescription pain medication, you can comfortably wear a seatbelt, and you can safely maneuver your car and apply brakes. v. You may have sexual intercourse when it is comfortable.  9. FOLLOW UP in our office a. Please call [facility] at [phone] to set up an appointment to see your surgeon in the office for a follow-up appointment approximately 2-3 weeks after your surgery. b. Make sure that you call for this appointment the day you arrive home to insure a convenient appointment time.  10. IF YOU HAVE DISABILITY OR FAMILY LEAVE FORMS, BRING THEM TO THE OFFICE FOR PROCESSING.  DO NOT GIVE THEM TO YOUR DOCTOR.   WHEN TO CALL US [phone]: 1. Poor pain control 2. Reactions / problems with  new medications (rash/itching, nausea, etc)  3. Fever over 101.5 F (38.5 C) 4. Inability to urinate 5. Nausea and/or vomiting 6. Worsening swelling or bruising 7. Continued bleeding from incision. 8. Increased pain, redness, or drainage from the incision   The clinic staff is available to answer your questions during regular business hours (8:30am-5pm).  Please don't hesitate to call and ask to speak to one of our nurses for clinical concerns.   If you have a medical emergency, go to the nearest emergency room or call 911.  A surgeon from [facility] is always on call at the hospitals   [facility] [address], [address], [place], [place]  [address] ? MAIN: [phone] ? TOLL FREE: [phone] ?  FAX [phone] [email or web address]   

## 2020-03-30 NOTE — Anesthesia Postprocedure Evaluation (Signed)
Anesthesia Post Note  Patient: Brittany Esparza  Procedure(s) Performed: LAPAROSCOPIC CHOLECYSTECTOMY WITH INTRAOPERATIVE CHOLANGIOGRAM, AND LIVER BIOPSY (N/A )     Patient location during evaluation: PACU Anesthesia Type: General Level of consciousness: awake and alert Pain management: pain level controlled Vital Signs Assessment: post-procedure vital signs reviewed and stable Respiratory status: spontaneous breathing, nonlabored ventilation, respiratory function stable and patient connected to nasal cannula oxygen Cardiovascular status: blood pressure returned to baseline and stable Postop Assessment: no apparent nausea or vomiting Anesthetic complications: no   No complications documented.  Last Vitals:  Vitals:   03/30/20 1113 03/30/20 1329  BP: 121/79 124/73  Pulse: 69 75  Resp: 20 18  Temp: 36.5 C 36.7 C  SpO2: 96% 98%    Last Pain:  Vitals:   03/30/20 1329  TempSrc: Oral  PainSc:                  Kennieth Rad

## 2020-03-30 NOTE — Interval H&P Note (Signed)
History and Physical Interval Note:  03/30/2020 7:23 AM  Brittany Esparza  has presented today for surgery, with the diagnosis of symptomatic cholelithiasis.  The various methods of treatment have been discussed with the patient and family. After consideration of risks, benefits and other options for treatment, the patient has consented to  Procedure(s): LAPAROSCOPIC CHOLECYSTECTOMY WITH INTRAOPERATIVE CHOLANGIOGRAM, AND LIVER BIOPSY (N/A) as a surgical intervention.  The patient's history has been reviewed, patient examined, no change in status, stable for surgery.  I have reviewed the patient's chart and labs.  Questions were answered to the patient's satisfaction.    The anatomy & physiology of hepatobiliary & pancreatic function was discussed.  The pathophysiology of gallbladder dysfunction was discussed.  Natural history risks without surgery was discussed.   I feel the risks of no intervention will lead to serious problems that outweigh the operative risks; therefore, I recommended cholecystectomy to remove the pathology.  I explained laparoscopic techniques with possible need for an open approach.  Probable cholangiogram to evaluate the bilary tract was explained as well.    Risks such as bleeding, infection, diarrhea and other bowel changes, abscess, leak, injury to other organs, need for repair of tissues / organs, need for further treatment, stroke, heart attack, death, and other risks were discussed.  I noted a good likelihood this will help address the problem, but there is a chance it may not help.  Possibility that this will not correct all abdominal symptoms was explained.  Goals of post-operative recovery were discussed as well.  We will work to minimize complications.  An educational handout further explaining the pathology and treatment options was given as well.  Questions were answered.  The patient expresses understanding & wishes to proceed with surgery.  I have re-reviewed the  the patient's records, history, medications, and allergies.  I have re-examined the patient.  I again discussed intraoperative plans and goals of post-operative recovery.  The patient agrees to proceed.  Brittany Esparza  1981/02/24 [id number]  Patient Care Team: Liam Rogers Harvie Junior, MD as PCP - General (Family Medicine)  Patient Active Problem List   Diagnosis Date Noted   Obesity 03/29/2020   Depression 03/29/2020   Hypothyroidism 03/29/2020   GERD (gastroesophageal reflux disease) 03/29/2020   Biliary obstruction 03/28/2020    Past Medical History:  Diagnosis Date   Anemia    Depression    GERD (gastroesophageal reflux disease)    PCOS (polycystic ovarian syndrome)    Thyroid disease    Vitamin D deficiency     Past Surgical History:  Procedure Laterality Date   CESAREAN SECTION     WISDOM TOOTH EXTRACTION      Social History   Socioeconomic History   Marital status: Married    Spouse name: Not on file   Number of children: Not on file   Years of education: Not on file   Highest education level: Not on file  Occupational History   Not on file  Tobacco Use   Smoking status: Never Smoker   Smokeless tobacco: Never Used  Substance and Sexual Activity   Alcohol use: Yes    Comment: rare   Drug use: Never   Sexual activity: Not on file  Other Topics Concern   Not on file  Social History Narrative   Not on file   Social Determinants of Health   Financial Resource Strain: Not on file  Food Insecurity: Not on file  Transportation Needs: Not on file  Physical Activity:  Not on file  Stress: Not on file  Social Connections: Not on file  Intimate Partner Violence: Not on file    No family history on file.  Medications Prior to Admission  Medication Sig Dispense Refill Last Dose   levothyroxine (SYNTHROID) 112 MCG tablet Take 224 mcg by mouth daily before breakfast.   03/28/2020 at Unknown time   pantoprazole (PROTONIX) 40 MG tablet Take 40 mg by  mouth daily.   03/28/2020 at Unknown time   sertraline (ZOLOFT) 50 MG tablet Take 150 mg by mouth daily.   03/28/2020 at Unknown time    Current Facility-Administered Medications  Medication Dose Route Frequency Provider Last Rate Last Admin   bisacodyl (DULCOLAX) EC tablet 5 mg  5 mg Oral Daily PRN Mayl, Delton See, MD       levothyroxine (SYNTHROID) tablet 224 mcg  224 mcg Oral Q0600 Mayl, Delton See, MD   224 mcg at 03/30/20 0541   multivitamin with minerals tablet 1 tablet  1 tablet Oral Daily Mayl, Delton See, MD   1 tablet at 03/29/20 1002   oxyCODONE (Oxy IR/ROXICODONE) immediate release tablet 5 mg  5 mg Oral Q4H PRN Mayl, Delton See, MD       pantoprazole (PROTONIX) EC tablet 40 mg  40 mg Oral Daily Mayl, Delton See, MD   40 mg at 03/29/20 1002   senna-docusate (Senokot-S) tablet 1 tablet  1 tablet Oral QHS PRN Mayl, Delton See, MD       sertraline (ZOLOFT) tablet 150 mg  150 mg Oral Daily Mayl, Delton See, MD   150 mg at 03/29/20 1002     Allergies  Allergen Reactions   Penicillins Dermatitis    BP 123/68 (BP Location: Left Arm)   Pulse 65   Temp 98.3 F (36.8 C) (Oral)   Resp 19   Ht  (1.651 m)   Wt (!) 159.7 kg   LMP 03/04/2020   SpO2 96%   BMI 58.58 kg/m   Labs: Results for orders placed or performed during the hospital encounter of 03/28/20 (from the past 48 hour(s))  Lipase, blood     Status: None   Collection Time: 03/28/20  3:32 PM  Result Value Ref Range   Lipase 23 11 - 51 U/L    Comment: Performed at [facility], [address] [place], [place] [address]  Comprehensive metabolic panel     Status: Abnormal   Collection Time: 03/28/20  3:32 PM  Result Value Ref Range   Sodium 136 135 - 145 mmol/L   Potassium 3.7 3.5 - 5.1 mmol/L   Chloride 103 98 - 111 mmol/L   CO2 23 22 - 32 mmol/L   Glucose, Bld 97 70 - 99 mg/dL    Comment: Glucose reference range applies only to samples taken after fasting for at least 8 hours.   BUN 8 6 - 20  mg/dL   Creatinine, Ser 6.04 0.44 - 1.00 mg/dL   Calcium 8.6 (L) 8.9 - 10.3 mg/dL   Total Protein 7.2 6.5 - 8.1 g/dL   Albumin 3.8 3.5 - 5.0 g/dL   AST 540 (H) 15 - 41 U/L   ALT 149 (H) 0 - 44 U/L   Alkaline Phosphatase 175 (H) 38 - 126 U/L   Total Bilirubin 3.0 (H) 0.3 - 1.2 mg/dL   GFR, Estimated >98 >11 mL/min    Comment: (NOTE) Calculated using the CKD-EPI Creatinine Equation (2021)    Anion gap 10 5 -  15    Comment: Performed at [facility], [address] [place], [place] [address]  CBC     Status: Abnormal   Collection Time: 03/28/20  3:32 PM  Result Value Ref Range   WBC 7.5 4.0 - 10.5 K/uL   RBC 5.25 (H) 3.87 - 5.11 MIL/uL   Hemoglobin 13.5 12.0 - 15.0 g/dL   HCT 19.1 47.8 - 29.5 %   MCV 79.4 (L) 80.0 - 100.0 fL   MCH 25.7 (L) 26.0 - 34.0 pg   MCHC 32.4 30.0 - 36.0 g/dL   RDW 62.1 30.8 - 65.7 %   Platelets 205 150 - 400 K/uL   nRBC 0.0 0.0 - 0.2 %    Comment: Performed at [facility], [address] [place], [place] [address]  Urinalysis, Routine w reflex microscopic Urine, Clean Catch     Status: Abnormal   Collection Time: 03/28/20  3:32 PM  Result Value Ref Range   Color, Urine YELLOW YELLOW   APPearance HAZY (A) CLEAR   Specific Gravity, Urine 1.010 1.005 - 1.030   pH 7.5 5.0 - 8.0   Glucose, UA NEGATIVE NEGATIVE mg/dL   Hgb urine dipstick NEGATIVE NEGATIVE   Bilirubin Urine SMALL (A) NEGATIVE   Ketones, ur NEGATIVE NEGATIVE mg/dL   Protein, ur NEGATIVE NEGATIVE mg/dL   Nitrite NEGATIVE NEGATIVE   Leukocytes,Ua NEGATIVE NEGATIVE    Comment: Microscopic not done on urines with negative protein, blood, leukocytes, nitrite, or glucose < 500 mg/dL. Performed at [facility], [address] [place], [place] [address]   Pregnancy, urine     Status: None   Collection Time: 03/28/20  3:32 PM  Result Value Ref Range   Preg Test, Ur NEGATIVE NEGATIVE    Comment:        THE SENSITIVITY OF THIS METHODOLOGY IS >20  mIU/mL. Performed at [facility], [address] [place], [place] [address]   Resp Panel by RT-PCR (Flu A&B, Covid) Nasopharyngeal Swab     Status: None   Collection Time: 03/28/20  5:51 PM   Specimen: Nasopharyngeal Swab; Nasopharyngeal(NP) swabs in vial transport medium  Result Value Ref Range   SARS Coronavirus 2 by RT PCR NEGATIVE NEGATIVE    Comment: (NOTE) SARS-CoV-2 target nucleic acids are NOT DETECTED.  The SARS-CoV-2 RNA is generally detectable in upper respiratory specimens during the acute phase of infection. The lowest concentration of SARS-CoV-2 viral copies this assay can detect is 138 copies/mL. A negative result does not preclude SARS-Cov-2 infection and should not be used as the sole basis for treatment or other patient management decisions. A negative result may occur with  improper specimen collection/handling, submission of specimen other than nasopharyngeal swab, presence of viral mutation(s) within the areas targeted by this assay, and inadequate number of viral copies(<138 copies/mL). A negative result must be combined with clinical observations, patient history, and epidemiological information. The expected result is Negative.  Fact Sheet for Patients:  BloggerCourse.com  Fact Sheet for Healthcare Providers:  SeriousBroker.it  This test is no t yet approved or cleared by the [place] FDA and  has been authorized for detection and/or diagnosis of SARS-CoV-2 by FDA under an Emergency Use Authorization (EUA). This EUA will remain  in effect (meaning this test can be used) for the duration of the COVID-19 declaration under Section 564(b)(1) of the Act, 21 U.S.C.section 360bbb-3(b)(1), unless the authorization is terminated  or revoked sooner.  Influenza A by PCR NEGATIVE NEGATIVE   Influenza B by PCR NEGATIVE NEGATIVE    Comment: (NOTE) The Xpert Xpress SARS-CoV-2/FLU/RSV plus assay  is intended as an aid in the diagnosis of influenza from Nasopharyngeal swab specimens and should not be used as a sole basis for treatment. Nasal washings and aspirates are unacceptable for Xpert Xpress SARS-CoV-2/FLU/RSV testing.  Fact Sheet for Patients: BloggerCourse.com  Fact Sheet for Healthcare Providers: SeriousBroker.it  This test is not yet approved or cleared by the [place] FDA and has been authorized for detection and/or diagnosis of SARS-CoV-2 by FDA under an Emergency Use Authorization (EUA). This EUA will remain in effect (meaning this test can be used) for the duration of the COVID-19 declaration under Section 564(b)(1) of the Act, 21 U.S.C. section 360bbb-3(b)(1), unless the authorization is terminated or revoked.  Performed at [facility], [address] [place], [place] [address]   HIV Antibody (routine testing w rflx)     Status: None   Collection Time: 03/29/20  4:19 AM  Result Value Ref Range   HIV Screen 4th Generation wRfx Non Reactive Non Reactive    Comment: Performed at [facility], [address], [place], [place] [address]  Comprehensive metabolic panel     Status: Abnormal   Collection Time: 03/29/20  4:19 AM  Result Value Ref Range   Sodium 138 135 - 145 mmol/L   Potassium 3.6 3.5 - 5.1 mmol/L   Chloride 103 98 - 111 mmol/L   CO2 25 22 - 32 mmol/L   Glucose, Bld 103 (H) 70 - 99 mg/dL    Comment: Glucose reference range applies only to samples taken after fasting for at least 8 hours.   BUN 8 6 - 20 mg/dL   Creatinine, Ser 2.84 0.44 - 1.00 mg/dL   Calcium 8.4 (L) 8.9 - 10.3 mg/dL   Total Protein 6.3 (L) 6.5 - 8.1 g/dL   Albumin 3.3 (L) 3.5 - 5.0 g/dL   AST 132 (H) 15 - 41 U/L   ALT 116 (H) 0 - 44 U/L   Alkaline Phosphatase 152 (H) 38 - 126 U/L   Total Bilirubin 1.4 (H) 0.3 - 1.2 mg/dL   GFR, Estimated >44 >01 mL/min    Comment: (NOTE) Calculated using the CKD-EPI  Creatinine Equation (2021)    Anion gap 10 5 - 15    Comment: Performed at [facility], [address], [place], [place] [address]  Comprehensive metabolic panel     Status: Abnormal   Collection Time: 03/30/20  3:43 AM  Result Value Ref Range   Sodium 140 135 - 145 mmol/L   Potassium 3.7 3.5 - 5.1 mmol/L   Chloride 102 98 - 111 mmol/L   CO2 28 22 - 32 mmol/L   Glucose, Bld 114 (H) 70 - 99 mg/dL    Comment: Glucose reference range applies only to samples taken after fasting for at least 8 hours.   BUN 9 6 - 20 mg/dL   Creatinine, Ser 3.66 0.44 - 1.00 mg/dL   Calcium 8.5 (L) 8.9 - 10.3 mg/dL   Total Protein 6.1 (L) 6.5 - 8.1 g/dL   Albumin 3.1 (L) 3.5 - 5.0 g/dL   AST 440 (H) 15 - 41 U/L   ALT 147 (H) 0 - 44 U/L   Alkaline Phosphatase 195 (H) 38 - 126 U/L   Total Bilirubin 2.4 (H) 0.3 - 1.2 mg/dL   GFR, Estimated >34 >74 mL/min  Comment: (NOTE) Calculated using the CKD-EPI Creatinine Equation (2021)    Anion gap 10 5 - 15    Comment: Performed at [facility], [address], [place], [place] [address]  CBC     Status: Abnormal   Collection Time: 03/30/20  3:43 AM  Result Value Ref Range   WBC 6.6 4.0 - 10.5 K/uL   RBC 4.60 3.87 - 5.11 MIL/uL   Hemoglobin 11.7 (L) 12.0 - 15.0 g/dL   HCT 59.4 58.5 - 92.9 %   MCV 82.2 80.0 - 100.0 fL   MCH 25.4 (L) 26.0 - 34.0 pg   MCHC 31.0 30.0 - 36.0 g/dL   RDW 24.4 62.8 - 63.8 %   Platelets 181 150 - 400 K/uL   nRBC 0.0 0.0 - 0.2 %    Comment: Performed at [facility], [address] [place], [place] [address]    Imaging / Studies: MR 3D Recon At Scanner  Result Date: 03/29/2020 CLINICAL DATA:  39 year old female with history of right upper quadrant abdominal pain. Possible common bile duct dilatation noted on recent ultrasound examination. EXAM: MRI ABDOMEN WITHOUT AND WITH CONTRAST (INCLUDING MRCP) TECHNIQUE: Multiplanar multisequence MR imaging of the abdomen was  performed both before and after the administration of intravenous contrast. Heavily T2-weighted images of the biliary and pancreatic ducts were obtained, and three-dimensional MRCP images were rendered by post processing. CONTRAST:  62mL GADAVIST GADOBUTROL 1 MMOL/ML IV SOLN COMPARISON:  No prior abdominal MRI. Abdominal ultrasound 03/28/2020. FINDINGS: Lower chest: Unremarkable. Hepatobiliary: Liver is enlarged measuring up to 23.2 cm in craniocaudal span. Severe diffuse loss of signal intensity throughout the hepatic parenchyma on out of phase dual echo images, indicative of a background of severe hepatic steatosis. No suspicious cystic or solid hepatic lesions. No intra or extrahepatic biliary ductal dilatation noted on MRCP images. Common bile duct measures up to 5 mm in the porta hepatis. No filling defect within the common bile duct to suggest choledocholithiasis. Multiple filling defects are noted within the gallbladder lying dependently, compatible with small gallstones. Gallbladder is not distended. Gallbladder wall thickness is normal. No pericholecystic fluid or surrounding inflammatory changes. Pancreas: No pancreatic mass. No pancreatic ductal dilatation noted on MRCP images. No pancreatic or peripancreatic fluid collections or inflammatory changes. Spleen:  Unremarkable. Adrenals/Urinary Tract: Subcentimeter T1 hypointense, T2 hyperintense, nonenhancing lesion in the lower pole of the left kidney, compatible with a simple cyst. Right kidney and bilateral adrenal glands are normal in appearance. No hydroureteronephrosis in the visualized portions of the abdomen. Stomach/Bowel: Visualized portions are unremarkable. Vascular/Lymphatic: No aneurysm. No lymphadenopathy identified in the visualized abdominal vasculature noted in the visualized abdomen. Other: No significant volume of ascites noted in the visualized portions of the peritoneal cavity. Musculoskeletal: No aggressive appearing osseous lesions  noted in the visualized portions of the skeleton. IMPRESSION: 1. Study is positive for cholelithiasis. However, there is no evidence of acute cholecystitis. Additionally, there is no evidence of choledocholithiasis or biliary tract obstruction. 2. Hepatomegaly with severe hepatic steatosis. Electronically Signed   By: Trudie Reed M.D.   On: 03/29/2020 07:13   MR ABDOMEN MRCP W WO CONTAST  Result Date: 03/29/2020 CLINICAL DATA:  39 year old female with history of right upper quadrant abdominal pain. Possible common bile duct dilatation noted on recent ultrasound examination. EXAM: MRI ABDOMEN WITHOUT AND WITH CONTRAST (INCLUDING MRCP) TECHNIQUE: Multiplanar multisequence MR imaging of the abdomen was performed both before and after the administration of intravenous contrast. Heavily T2-weighted images  of the biliary and pancreatic ducts were obtained, and three-dimensional MRCP images were rendered by post processing. CONTRAST:  10mL GADAVIST GADOBUTROL 1 MMOL/ML IV SOLN COMPARISON:  No prior abdominal MRI. Abdominal ultrasound 03/28/2020. FINDINGS: Lower chest: Unremarkable. Hepatobiliary: Liver is enlarged measuring up to 23.2 cm in craniocaudal span. Severe diffuse loss of signal intensity throughout the hepatic parenchyma on out of phase dual echo images, indicative of a background of severe hepatic steatosis. No suspicious cystic or solid hepatic lesions. No intra or extrahepatic biliary ductal dilatation noted on MRCP images. Common bile duct measures up to 5 mm in the porta hepatis. No filling defect within the common bile duct to suggest choledocholithiasis. Multiple filling defects are noted within the gallbladder lying dependently, compatible with small gallstones. Gallbladder is not distended. Gallbladder wall thickness is normal. No pericholecystic fluid or surrounding inflammatory changes. Pancreas: No pancreatic mass. No pancreatic ductal dilatation noted on MRCP images. No pancreatic or  peripancreatic fluid collections or inflammatory changes. Spleen:  Unremarkable. Adrenals/Urinary Tract: Subcentimeter T1 hypointense, T2 hyperintense, nonenhancing lesion in the lower pole of the left kidney, compatible with a simple cyst. Right kidney and bilateral adrenal glands are normal in appearance. No hydroureteronephrosis in the visualized portions of the abdomen. Stomach/Bowel: Visualized portions are unremarkable. Vascular/Lymphatic: No aneurysm. No lymphadenopathy identified in the visualized abdominal vasculature noted in the visualized abdomen. Other: No significant volume of ascites noted in the visualized portions of the peritoneal cavity. Musculoskeletal: No aggressive appearing osseous lesions noted in the visualized portions of the skeleton. IMPRESSION: 1. Study is positive for cholelithiasis. However, there is no evidence of acute cholecystitis. Additionally, there is no evidence of choledocholithiasis or biliary tract obstruction. 2. Hepatomegaly with severe hepatic steatosis. Electronically Signed   By: Trudie Reedaniel  Entrikin M.D.   On: 03/29/2020 07:13   US Abdomen Limited RUQ (LIVER/GB)  Result Date: 03/28/2020 CLINICAL DATA:  Right upper quadrant pain. EXAM: ULTRASOUND ABDOMEN LIMITED RIGHT UPPER QUADRANT COMPARISON:  None. FINDINGS: Gallbladder: Partially distended. Multiple intraluminal gallstones. No gallbladder wall thickening or pericholecystic fluid. No sonographic Murphy sign noted by sonographer. Common bile duct: Diameter: 9 mm, dilated.  No visualized choledocholithiasis. Liver: Heterogeneously increased hepatic echogenicity. The liver parenchyma is difficult to penetrate. No evidence of focal lesion on this limited exam. Portal vein is patent on color Doppler imaging with normal direction of blood flow towards the liver. Other: No right upper quadrant ascites. IMPRESSION: 1. Gallstones without sonographic findings of acute cholecystitis. 2. Dilated common bile duct 9 mm. No  visualized choledocholithiasis. Consider further evaluation with MRCP. MRCP should only be considered if patient is able to tolerate breath hold technique. 3. Hepatic steatosis. Electronically Signed   By: Narda RutherfordMelanie  Sanford M.D.   On: 03/28/2020 16:10     .Ardeth SportsmanSteven C. Nakina Spatz, M.D., F.A.C.S. Gastrointestinal and Minimally Invasive Surgery [facility] [address] [place], [place] [id number] [phone] Main / Paging  03/30/2020 7:23 AM    Ardeth SportsmanSteven C Kenita Bines

## 2020-03-31 ENCOUNTER — Encounter (HOSPITAL_COMMUNITY): Payer: Self-pay | Admitting: Surgery

## 2020-03-31 DIAGNOSIS — K8071 Calculus of gallbladder and bile duct without cholecystitis with obstruction: Secondary | ICD-10-CM | POA: Diagnosis not present

## 2020-03-31 DIAGNOSIS — K831 Obstruction of bile duct: Secondary | ICD-10-CM | POA: Diagnosis not present

## 2020-03-31 DIAGNOSIS — F329 Major depressive disorder, single episode, unspecified: Secondary | ICD-10-CM

## 2020-03-31 DIAGNOSIS — K8000 Calculus of gallbladder with acute cholecystitis without obstruction: Secondary | ICD-10-CM | POA: Diagnosis not present

## 2020-03-31 LAB — COMPREHENSIVE METABOLIC PANEL
ALT: 209 U/L — ABNORMAL HIGH (ref 0–44)
AST: 177 U/L — ABNORMAL HIGH (ref 15–41)
Albumin: 3.4 g/dL — ABNORMAL LOW (ref 3.5–5.0)
Alkaline Phosphatase: 191 U/L — ABNORMAL HIGH (ref 38–126)
Anion gap: 10 (ref 5–15)
BUN: 9 mg/dL (ref 6–20)
CO2: 25 mmol/L (ref 22–32)
Calcium: 8.5 mg/dL — ABNORMAL LOW (ref 8.9–10.3)
Chloride: 103 mmol/L (ref 98–111)
Creatinine, Ser: 0.78 mg/dL (ref 0.44–1.00)
GFR, Estimated: >60 mL/min (ref >60)
Glucose, Bld: 126 mg/dL — ABNORMAL HIGH (ref 70–99)
Potassium: 4 mmol/L (ref 3.5–5.1)
Sodium: 138 mmol/L (ref 135–145)
Total Bilirubin: 1.2 mg/dL (ref 0.3–1.2)
Total Protein: 6.7 g/dL (ref 6.5–8.1)

## 2020-03-31 MED ORDER — OXYCODONE HCL 5 MG PO TABS: 5.0000 mg | ORAL_TABLET | ORAL | 0 refills | Status: AC | PRN

## 2020-03-31 NOTE — Progress Notes (Signed)
ELANIA CROWL [id number] 04-Feb-1981  CARE TEAM:  PCP: Alfonso Patten, MD  Outpatient Care Team: Patient Care Team: Skidmore, Harvie Junior, MD as PCP - General (Family Medicine)  Inpatient Treatment Team: Treatment Team: Attending Provider: Mayl, Delton See, MD; Consulting Physician: Vida Rigger, MD; Rounding Team: Delmer Islam, MD; Social Worker: Desmond Lope; Consulting Physician: Montez Morita, Md, MD; Utilization Review: Jon Billings, RN; Registered Nurse: Delsa Sale, RN; Registered Nurse: Malena Edman, RN   Problem List:   Principal Problem:   Acute calculous cholecystitis s/p lap cholecystectomy 03/30/2020 Active Problems:   Morbid obesity with BMI of 60.0-69.9, adult (HCC)   Biliary obstruction   Depressive disorder   Hypothyroidism   Gastro-esophageal reflux disease without esophagitis   Steatohepatitis, nonalcoholic   1 Day Post-Op  03/30/2020  POST-OPERATIVE DIAGNOSIS:   Acute on Chronic Calculus Cholecystitis Liver: Fatty steatohepatitis  PROCEDURE:   Core Liver Biopsy Laparoscopic cholecystectomy Intraoperative cholangiogram  SURGEON:  Ardeth Sportsman, MD, FACS.     Assessment  Recovering  [facility] Stay = 1 days)  Plan:  Solid diet.  Pain control.  Most likely can discharge later today if medicine agrees.  Meeting goals.  Fatty change with some fibrosis in the liver grossly.  Biopsy done.  Patient notes she has known steatohepatitis with prior liver biopsy.  Gets annual labs..  Severe morbid obesity.  Is trying to focus on intentional weight loss this coming year  VTE prophylaxis- SCDs, etc  Mobilize as tolerated to help recovery  Disposition:  Disposition:  The patient is from: Home  Anticipate discharge to:  Home  Anticipated Date of Discharge is:  March 31, 2020  Barriers to discharge:  NONE  Patient currently is Stable for discharge from the hospital from a surgery  standpoint.   20 minutes spent in review, evaluation, examination, counseling, and coordination of care.  More than 50% of that time was spent in counseling.  03/31/2020    Subjective: (Chief complaint)  Patient feeling much better.  Telling jokes.  On the phone with her mother.  Glad to not have the pain anymore.  He had questions about surgery.  I discussed with her  Objective:  Vital signs:  Vitals:   03/30/20 1329 03/30/20 1804 03/30/20 2024 03/31/20 0355  BP: 124/73 131/69 (!) 150/82 122/77  Pulse: 75 86 84 64  Resp: 18 18 19 18   Temp: 98 F (36.7 C) 98.6 F (37 C) 98.8 F (37.1 C) 98.3 F (36.8 C)  TempSrc: Oral Oral Oral Oral  SpO2: 98% 97% 97% 98%  Weight:      Height:        Last BM Date: 03/26/20  Intake/Output   Yesterday:  12/18 0701 - 12/19 0700 In: 2172.3 [P.O.:360; I.V.:1650; IV Piggyback:162.3] Out: 20 [Blood:20] This shift:  No intake/output data recorded.  Bowel function:  Flatus: YES  BM:  No  Drain: (No drain)   Physical Exam:  General: Pt awake/alert in no acute distress Eyes: PERRL, normal EOM.  Sclera clear.  No icterus Neuro: CN II-XII intact w/o focal sensory/motor deficits. Lymph: No head/neck/groin lymphadenopathy Psych:  No delerium/psychosis/paranoia.  Oriented x 4 HENT: Normocephalic, Mucus membranes moist.  No thrush Neck: Supple, No tracheal deviation.  No obvious thyromegaly Chest: No pain to chest wall compression.  Good respiratory excursion.  No audible wheezing CV:  Pulses intact.  Regular rhythm.  No major extremity edema MS: Normal AROM mjr joints.  No obvious deformity  Abdomen:  Soft. Obese but  Nondistended.  Mildly tender at incisions only.  No evidence of peritonitis.  No incarcerated hernias.  Ext:  No deformity.  No mjr edema.  No cyanosis Skin: No petechiae / purpurea.  No major sores.  Warm and dry    Results:   Cultures: Recent Results (from the past 720 hour(s))  Resp Panel by RT-PCR (Flu  A&B, Covid) Nasopharyngeal Swab     Status: None   Collection Time: 03/28/20  5:51 PM   Specimen: Nasopharyngeal Swab; Nasopharyngeal(NP) swabs in vial transport medium  Result Value Ref Range Status   SARS Coronavirus 2 by RT PCR NEGATIVE NEGATIVE Final    Comment: (NOTE) SARS-CoV-2 target nucleic acids are NOT DETECTED.  The SARS-CoV-2 RNA is generally detectable in upper respiratory specimens during the acute phase of infection. The lowest concentration of SARS-CoV-2 viral copies this assay can detect is 138 copies/mL. A negative result does not preclude SARS-Cov-2 infection and should not be used as the sole basis for treatment or other patient management decisions. A negative result may occur with  improper specimen collection/handling, submission of specimen other than nasopharyngeal swab, presence of viral mutation(s) within the areas targeted by this assay, and inadequate number of viral copies(<138 copies/mL). A negative result must be combined with clinical observations, patient history, and epidemiological information. The expected result is Negative.  Fact Sheet for Patients:  BloggerCourse.comhttps://www.fda.gov/media/152166/download  Fact Sheet for Healthcare Providers:  SeriousBroker.ithttps://www.fda.gov/media/152162/download  This test is no t yet approved or cleared by the [place] FDA and  has been authorized for detection and/or diagnosis of SARS-CoV-2 by FDA under an Emergency Use Authorization (EUA). This EUA will remain  in effect (meaning this test can be used) for the duration of the COVID-19 declaration under Section 564(b)(1) of the Act, 21 U.S.C.section 360bbb-3(b)(1), unless the authorization is terminated  or revoked sooner.       Influenza A by PCR NEGATIVE NEGATIVE Final   Influenza B by PCR NEGATIVE NEGATIVE Final    Comment: (NOTE) The Xpert Xpress SARS-CoV-2/FLU/RSV plus assay is intended as an aid in the diagnosis of influenza from Nasopharyngeal swab specimens  and should not be used as a sole basis for treatment. Nasal washings and aspirates are unacceptable for Xpert Xpress SARS-CoV-2/FLU/RSV testing.  Fact Sheet for Patients: BloggerCourse.comhttps://www.fda.gov/media/152166/download  Fact Sheet for Healthcare Providers: SeriousBroker.ithttps://www.fda.gov/media/152162/download  This test is not yet approved or cleared by the [place] FDA and has been authorized for detection and/or diagnosis of SARS-CoV-2 by FDA under an Emergency Use Authorization (EUA). This EUA will remain in effect (meaning this test can be used) for the duration of the COVID-19 declaration under Section 564(b)(1) of the Act, 21 U.S.C. section 360bbb-3(b)(1), unless the authorization is terminated or revoked.  Performed at [facility] [place], [address] [place], [place] [id number]     Labs: Results for orders placed or performed during the hospital encounter of 03/28/20 (from the past 48 hour(s))  Comprehensive metabolic panel     Status: Abnormal   Collection Time: 03/30/20  3:43 AM  Result Value Ref Range   Sodium 140 135 - 145 mmol/L   Potassium 3.7 3.5 - 5.1 mmol/L   Chloride 102 98 - 111 mmol/L   CO2 28 22 - 32 mmol/L   Glucose, Bld 114 (H) 70 - 99 mg/dL    Comment: Glucose reference range applies only to samples taken after fasting for at least 8 hours.   BUN 9 6 - 20 mg/dL  Creatinine, Ser 0.83 0.44 - 1.00 mg/dL   Calcium 8.5 (L) 8.9 - 10.3 mg/dL   Total Protein 6.1 (L) 6.5 - 8.1 g/dL   Albumin 3.1 (L) 3.5 - 5.0 g/dL   AST 409 (H) 15 - 41 U/L   ALT 147 (H) 0 - 44 U/L   Alkaline Phosphatase 195 (H) 38 - 126 U/L   Total Bilirubin 2.4 (H) 0.3 - 1.2 mg/dL   GFR, Estimated >81 >19 mL/min    Comment: (NOTE) Calculated using the CKD-EPI Creatinine Equation (2021)    Anion gap 10 5 - 15    Comment: Performed at [facility], [address], [place], [place] [address]  CBC     Status: Abnormal   Collection Time: 03/30/20  3:43 AM  Result Value Ref  Range   WBC 6.6 4.0 - 10.5 K/uL   RBC 4.60 3.87 - 5.11 MIL/uL   Hemoglobin 11.7 (L) 12.0 - 15.0 g/dL   HCT 95.6 21.3 - 08.6 %   MCV 82.2 80.0 - 100.0 fL   MCH 25.4 (L) 26.0 - 34.0 pg   MCHC 31.0 30.0 - 36.0 g/dL   RDW 57.8 46.9 - 62.9 %   Platelets 181 150 - 400 K/uL   nRBC 0.0 0.0 - 0.2 %    Comment: Performed at [facility], [address], [place], [place] [address]  Comprehensive metabolic panel     Status: Abnormal   Collection Time: 03/30/20 10:47 AM  Result Value Ref Range   Sodium 136 135 - 145 mmol/L   Potassium 3.5 3.5 - 5.1 mmol/L   Chloride 101 98 - 111 mmol/L   CO2 24 22 - 32 mmol/L   Glucose, Bld 161 (H) 70 - 99 mg/dL    Comment: Glucose reference range applies only to samples taken after fasting for at least 8 hours.   BUN 9 6 - 20 mg/dL   Creatinine, Ser 3.24 0.44 - 1.00 mg/dL   Calcium 8.5 (L) 8.9 - 10.3 mg/dL   Total Protein 6.9 6.5 - 8.1 g/dL   Albumin 3.4 (L) 3.5 - 5.0 g/dL   AST 401 (H) 15 - 41 U/L   ALT 207 (H) 0 - 44 U/L   Alkaline Phosphatase 223 (H) 38 - 126 U/L   Total Bilirubin 3.1 (H) 0.3 - 1.2 mg/dL   GFR, Estimated >02 >72 mL/min    Comment: (NOTE) Calculated using the CKD-EPI Creatinine Equation (2021)    Anion gap 11 5 - 15    Comment: Performed at [facility], [address] [place], [place] [address]  CBC     Status: Abnormal   Collection Time: 03/30/20 10:47 AM  Result Value Ref Range   WBC 8.8 4.0 - 10.5 K/uL   RBC 4.85 3.87 - 5.11 MIL/uL   Hemoglobin 12.4 12.0 - 15.0 g/dL   HCT 40.3 47.4 - 25.9 %   MCV 81.4 80.0 - 100.0 fL   MCH 25.6 (L) 26.0 - 34.0 pg   MCHC 31.4 30.0 - 36.0 g/dL   RDW 56.3 87.5 - 64.3 %   Platelets 190 150 - 400 K/uL   nRBC 0.0 0.0 - 0.2 %    Comment: Performed at [facility], [address], [place], [place] [address]  Comprehensive metabolic panel     Status: Abnormal   Collection Time: 03/31/20  3:39 AM  Result Value Ref Range   Sodium 138 135 -  145 mmol/L  Potassium 4.0 3.5 - 5.1 mmol/L   Chloride 103 98 - 111 mmol/L   CO2 25 22 - 32 mmol/L   Glucose, Bld 126 (H) 70 - 99 mg/dL    Comment: Glucose reference range applies only to samples taken after fasting for at least 8 hours.   BUN 9 6 - 20 mg/dL   Creatinine, Ser 9.74 0.44 - 1.00 mg/dL   Calcium 8.5 (L) 8.9 - 10.3 mg/dL   Total Protein 6.7 6.5 - 8.1 g/dL   Albumin 3.4 (L) 3.5 - 5.0 g/dL   AST 163 (H) 15 - 41 U/L   ALT 209 (H) 0 - 44 U/L   Alkaline Phosphatase 191 (H) 38 - 126 U/L   Total Bilirubin 1.2 0.3 - 1.2 mg/dL   GFR, Estimated >84 >53 mL/min    Comment: (NOTE) Calculated using the CKD-EPI Creatinine Equation (2021)    Anion gap 10 5 - 15    Comment: Performed at [facility], [address], [place], [place] [address]    Imaging / Studies: DG Cholangiogram Operative  Result Date: 03/30/2020 CLINICAL DATA:  Cholecystectomy for symptomatic cholelithiasis. EXAM: INTRAOPERATIVE CHOLANGIOGRAM TECHNIQUE: Cholangiographic images from the C-arm fluoroscopic device were submitted for interpretation post-operatively. Please see the procedural report for the amount of contrast and the fluoroscopy time utilized. COMPARISON:  None. FINDINGS: Intraoperative imaging with a C-arm demonstrates normal opacified bile ducts without evidence of filling defect or obstruction. Contrast enters the duodenum normally. IMPRESSION: Normal intraoperative cholangiogram. Electronically Signed   By: Irish Lack M.D.   On: 03/30/2020 09:12    Medications / Allergies: per chart  Antibiotics: Anti-infectives (From admission, onward)   Start     Dose/Rate Route Frequency Ordered Stop   03/30/20 0815  clindamycin (CLEOCIN) IVPB 900 mg       Note to Pharmacy: Pharmacy may adjust dosing strength, interval, or rate of medication as needed for optimal therapy for the patient Send with patient on call to the OR.  Anesthesia to complete antibiotic administration <53min prior to  incision per [facility].   900 mg 100 mL/hr over 30 Minutes Intravenous On call to O.R. 03/30/20 3212 03/30/20 0823   03/30/20 0815  gentamicin (GARAMYCIN) 490 mg in dextrose 5 % 100 mL IVPB        5 mg/kg  98.1 kg (Adjusted) 112.3 mL/hr over 60 Minutes Intravenous On call to O.R. 03/30/20 0810 03/30/20 0841   03/30/20 0801  clindamycin (CLEOCIN) 900 MG/50ML IVPB       Note to Pharmacy: Myrlene Broker   : cabinet override      03/30/20 0801 03/30/20 0808   03/30/20 0800  clindamycin (CLEOCIN) IVPB 600 mg  Status:  Discontinued       Note to Pharmacy: Pharmacy may adjust dosing strength, interval, or rate of medication as needed for optimal therapy for the patient Send with patient on call to the OR.  Anesthesia to complete antibiotic administration <42min prior to incision per [facility].   600 mg 100 mL/hr over 30 Minutes Intravenous On call to O.R. 03/30/20 0756 03/30/20 0806   03/30/20 0800  gentamicin (GARAMYCIN) IVPB 100 mg  Status:  Discontinued       Note to Pharmacy: Pharmacy may adjust dosing strength, schedule, rate of infusion, etc as needed to optimize therapy Send with patient on call to the OR.  Anesthesia to complete antibiotic administration <75min prior to incision per [facility].   100 mg 200 mL/hr over 30  Minutes Intravenous On call to O.R. 03/30/20 0756 03/30/20 5643        Note: Portions of this report may have been transcribed using voice recognition software. Every effort was made to ensure accuracy; however, inadvertent computerized transcription errors may be present.   Any transcriptional errors that result from this process are unintentional.    Ardeth Sportsman, MD, FACS, MASCRS Gastrointestinal and Minimally Invasive Surgery  [facility] [address], [address] [place], [place] [address] [phone] Fax [phone] Main/Paging  CONTACT INFORMATION: Weekday (9AM-5PM) concerns: Call [facility] main office at  [phone] Weeknight (5PM-9AM) or Weekend/Holiday concerns: Check [email or web address] for General Surgery [facility] coverage (Please, do not use SecureChat as it is not reliable communication to operating surgeons for immediate patient care)      03/31/2020  8:37 AM

## 2020-03-31 NOTE — Discharge Summary (Signed)
Physician Discharge Summary  Brittany MarekCrystal G Mikkelsen ZOX:[id number] DOB: 02/16/1981 DOA: 03/28/2020  PCP: Alfonso PattenSkidmore, Elizabeth Faye, MD  Admit date: 03/28/2020 Discharge date: 03/31/2020  Admitted From: Home Disposition:  Home  Recommendations for Outpatient Follow-up:  1. Follow up with PCP in 1-2 weeks 2. Follow up with General Surgery as scheduled  Discharge Condition:Stable CODE STATUS:Full Diet recommendation: Regular   Brief/Interim Summary: [age] y.o.femalewith medical history significant ofobesity,hypothyroidism, GERD, depressionwho presentswithbiliaryobstruction as a transfer from urgent care. Found to have cholelithiasis on imaging. Initial abd US with dilated CBD of 9mm with subsequent MRCP being neg for obstructing stone  Discharge Diagnoses:  Principal Problem:   Acute calculous cholecystitis s/p lap cholecystectomy 03/30/2020 Active Problems:   Biliary obstruction   Morbid obesity with BMI of 60.0-69.9, adult (HCC)   Depressive disorder   Hypothyroidism   Gastro-esophageal reflux disease without esophagitis   Steatohepatitis, nonalcoholic   Biliary obstruction Cholelithiasis Initial concerns of dilated common bile ductwith cholelithiasis on abd US F/u MRCP without obstructing stone noted, suspicion for passed stone GI consulted who recommended surgical consult General Surgery had followed Pt is now s/p surgery as of 12/18 Diet was successfully advanced and pt cleared for d/c today by general surgery  Hypothyroidism Continue with synthroid as pt tolerates  Depression Continued on home sertraline   GERD On Protonixprior to admit   Discharge Instructions  Discharge Instructions    Call MD for:   Complete by: As directed    FEVER > 101.5 F  (temperatures < 101.5 F are not significant)   Call MD for:  extreme fatigue   Complete by: As directed    Call MD for:  persistant dizziness or light-headedness   Complete by: As directed    Call  MD for:  persistant nausea and vomiting   Complete by: As directed    Call MD for:  redness, tenderness, or signs of infection (pain, swelling, redness, odor or green/yellow discharge around incision site)   Complete by: As directed    Call MD for:  severe uncontrolled pain   Complete by: As directed    Diet - low sodium heart healthy   Complete by: As directed    Start with a bland diet such as soups, liquids, starchy foods, low fat foods, etc. the first few days at home. Gradually advance to a solid, low-fat, high fiber diet by the end of the first week at home.   Add a fiber supplement to your diet (Metamucil, etc) If you feel full, bloated, or constipated, stay on a full liquid or pureed/blenderized diet for a few days until you feel better and are no longer constipated.   Discharge instructions   Complete by: As directed    See Discharge Instructions If you are not getting better after two weeks or are noticing you are getting worse, contact our office [phone] for further advice.  We may need to adjust your medications, re-evaluate you in the office, send you to the emergency room, or see what other things we can do to help. The clinic staff is available to answer your questions during regular business hours (8:30am-5pm).  Please don't hesitate to call and ask to speak to one of our nurses for clinical concerns.    [facility] is always on call at the hospitals 24 hours/day If you have a medical emergency, go to the nearest emergency room or call 911.   Discharge wound care:   Complete by: As directed  It is good for closed incisions and even open wounds to be washed every day.  Shower every day.  Short baths are fine.  Wash the incisions and wounds clean with soap & water.    You may leave closed incisions open to air if it is dry.   You may cover the incision with clean gauze & replace it after your daily shower for comfort.  TEGADERM:  You have  clear gauze band-aid dressings over your closed incision(s).  Remove the dressings 3 days after surgery.   Driving Restrictions   Complete by: As directed    You may drive when: - you are no longer taking narcotic prescription pain medication - you can comfortably wear a seatbelt - you can safely make sudden turns/stops without pain.   Increase activity slowly   Complete by: As directed    Start light daily activities --- self-care, walking, climbing stairs- beginning the day after surgery.  Gradually increase activities as tolerated.  Control your pain to be active.  Stop when you are tired.  Ideally, walk several times a day, eventually an hour a day.   Most people are back to most day-to-day activities in a few weeks.  It takes 4-6 weeks to get back to unrestricted, intense activity. If you can walk 30 minutes without difficulty, it is safe to try more intense activity such as jogging, treadmill, bicycling, low-impact aerobics, swimming, etc. Save the most intensive and strenuous activity for last (Usually 4-8 weeks after surgery) such as sit-ups, heavy lifting, contact sports, etc.  Refrain from any intense heavy lifting or straining until you are off narcotics for pain control.  You will have off days, but things should improve week-by-week. DO NOT PUSH THROUGH PAIN.  Let pain be your guide: If it hurts to do something, don't do it.   Lifting restrictions   Complete by: As directed    If you can walk 30 minutes without difficulty, it is safe to try more intense activity such as jogging, treadmill, bicycling, low-impact aerobics, swimming, etc. Save the most intensive and strenuous activity for last (Usually 4-8 weeks after surgery) such as sit-ups, heavy lifting, contact sports, etc.   Refrain from any intense heavy lifting or straining until you are off narcotics for pain control.  You will have off days, but things should improve week-by-week. DO NOT PUSH THROUGH PAIN.  Let pain be your  guide: If it hurts to do something, don't do it.  Pain is your body warning you to avoid that activity for another week until the pain goes down.   May shower / Bathe   Complete by: As directed    May walk up steps   Complete by: As directed    Remove dressing in 72 hours   Complete by: As directed    Make sure all dressings are removed by the third day after surgery = 12/21.  Leave incisions open to air.  OK to cover incisions with gauze or bandages as desired   Sexual Activity Restrictions   Complete by: As directed    You may have sexual intercourse when it is comfortable. If it hurts to do something, stop.     Allergies as of 03/31/2020      Reactions   Penicillins Dermatitis      Medication List    TAKE these medications   levothyroxine 112 MCG tablet Commonly known as: SYNTHROID Take 224 mcg by mouth daily before breakfast.   oxyCODONE 5 MG  immediate release tablet Commonly known as: Oxy IR/ROXICODONE Take 1 tablet (5 mg total) by mouth every 4 (four) hours as needed for moderate pain.   pantoprazole 40 MG tablet Commonly known as: PROTONIX Take 40 mg by mouth daily.   sertraline 50 MG tablet Commonly known as: ZOLOFT Take 150 mg by mouth daily.            Discharge Care Instructions  (From admission, onward)         Start     Ordered   03/31/20 0000  Discharge wound care:       Comments: It is good for closed incisions and even open wounds to be washed every day.  Shower every day.  Short baths are fine.  Wash the incisions and wounds clean with soap & water.    You may leave closed incisions open to air if it is dry.   You may cover the incision with clean gauze & replace it after your daily shower for comfort.  TEGADERM:  You have clear gauze band-aid dressings over your closed incision(s).  Remove the dressings 3 days after surgery.   03/31/20 0806          Follow-up Information    [facility] Schedule an appointment as soon as  possible for a visit in 3 weeks.   Specialty: General Surgery Why: To follow up after your operation, To follow up after your hospital stay Contact information: [address] [place] [address] [phone]       Alfonso Patten, MD. Schedule an appointment as soon as possible for a visit in 2 week(s).   Specialty: Family Medicine Contact information: [address] [place] [address] [phone]              Allergies  Allergen Reactions  . Penicillins Dermatitis    Consultations:  GI  General Surgery  Procedures/Studies: DG Cholangiogram Operative  Result Date: 03/30/2020 CLINICAL DATA:  Cholecystectomy for symptomatic cholelithiasis. EXAM: INTRAOPERATIVE CHOLANGIOGRAM TECHNIQUE: Cholangiographic images from the C-arm fluoroscopic device were submitted for interpretation post-operatively. Please see the procedural report for the amount of contrast and the fluoroscopy time utilized. COMPARISON:  None. FINDINGS: Intraoperative imaging with a C-arm demonstrates normal opacified bile ducts without evidence of filling defect or obstruction. Contrast enters the duodenum normally. IMPRESSION: Normal intraoperative cholangiogram. Electronically Signed   By: Irish Lack M.D.   On: 03/30/2020 09:12   MR 3D Recon At Scanner  Result Date: 03/29/2020 CLINICAL DATA:  39 year old female with history of right upper quadrant abdominal pain. Possible common bile duct dilatation noted on recent ultrasound examination. EXAM: MRI ABDOMEN WITHOUT AND WITH CONTRAST (INCLUDING MRCP) TECHNIQUE: Multiplanar multisequence MR imaging of the abdomen was performed both before and after the administration of intravenous contrast. Heavily T2-weighted images of the biliary and pancreatic ducts were obtained, and three-dimensional MRCP images were rendered by post processing. CONTRAST:  10mL GADAVIST GADOBUTROL 1 MMOL/ML IV SOLN COMPARISON:  No  prior abdominal MRI. Abdominal ultrasound 03/28/2020. FINDINGS: Lower chest: Unremarkable. Hepatobiliary: Liver is enlarged measuring up to 23.2 cm in craniocaudal span. Severe diffuse loss of signal intensity throughout the hepatic parenchyma on out of phase dual echo images, indicative of a background of severe hepatic steatosis. No suspicious cystic or solid hepatic lesions. No intra or extrahepatic biliary ductal dilatation noted on MRCP images. Common bile duct measures up to 5 mm in the porta hepatis. No filling defect within the common  bile duct to suggest choledocholithiasis. Multiple filling defects are noted within the gallbladder lying dependently, compatible with small gallstones. Gallbladder is not distended. Gallbladder wall thickness is normal. No pericholecystic fluid or surrounding inflammatory changes. Pancreas: No pancreatic mass. No pancreatic ductal dilatation noted on MRCP images. No pancreatic or peripancreatic fluid collections or inflammatory changes. Spleen:  Unremarkable. Adrenals/Urinary Tract: Subcentimeter T1 hypointense, T2 hyperintense, nonenhancing lesion in the lower pole of the left kidney, compatible with a simple cyst. Right kidney and bilateral adrenal glands are normal in appearance. No hydroureteronephrosis in the visualized portions of the abdomen. Stomach/Bowel: Visualized portions are unremarkable. Vascular/Lymphatic: No aneurysm. No lymphadenopathy identified in the visualized abdominal vasculature noted in the visualized abdomen. Other: No significant volume of ascites noted in the visualized portions of the peritoneal cavity. Musculoskeletal: No aggressive appearing osseous lesions noted in the visualized portions of the skeleton. IMPRESSION: 1. Study is positive for cholelithiasis. However, there is no evidence of acute cholecystitis. Additionally, there is no evidence of choledocholithiasis or biliary tract obstruction. 2. Hepatomegaly with severe hepatic steatosis.  Electronically Signed   By: Trudie Reed M.D.   On: 03/29/2020 07:13   MR ABDOMEN MRCP W WO CONTAST  Result Date: 03/29/2020 CLINICAL DATA:  39 year old female with history of right upper quadrant abdominal pain. Possible common bile duct dilatation noted on recent ultrasound examination. EXAM: MRI ABDOMEN WITHOUT AND WITH CONTRAST (INCLUDING MRCP) TECHNIQUE: Multiplanar multisequence MR imaging of the abdomen was performed both before and after the administration of intravenous contrast. Heavily T2-weighted images of the biliary and pancreatic ducts were obtained, and three-dimensional MRCP images were rendered by post processing. CONTRAST:  29mL GADAVIST GADOBUTROL 1 MMOL/ML IV SOLN COMPARISON:  No prior abdominal MRI. Abdominal ultrasound 03/28/2020. FINDINGS: Lower chest: Unremarkable. Hepatobiliary: Liver is enlarged measuring up to 23.2 cm in craniocaudal span. Severe diffuse loss of signal intensity throughout the hepatic parenchyma on out of phase dual echo images, indicative of a background of severe hepatic steatosis. No suspicious cystic or solid hepatic lesions. No intra or extrahepatic biliary ductal dilatation noted on MRCP images. Common bile duct measures up to 5 mm in the porta hepatis. No filling defect within the common bile duct to suggest choledocholithiasis. Multiple filling defects are noted within the gallbladder lying dependently, compatible with small gallstones. Gallbladder is not distended. Gallbladder wall thickness is normal. No pericholecystic fluid or surrounding inflammatory changes. Pancreas: No pancreatic mass. No pancreatic ductal dilatation noted on MRCP images. No pancreatic or peripancreatic fluid collections or inflammatory changes. Spleen:  Unremarkable. Adrenals/Urinary Tract: Subcentimeter T1 hypointense, T2 hyperintense, nonenhancing lesion in the lower pole of the left kidney, compatible with a simple cyst. Right kidney and bilateral adrenal glands are normal in  appearance. No hydroureteronephrosis in the visualized portions of the abdomen. Stomach/Bowel: Visualized portions are unremarkable. Vascular/Lymphatic: No aneurysm. No lymphadenopathy identified in the visualized abdominal vasculature noted in the visualized abdomen. Other: No significant volume of ascites noted in the visualized portions of the peritoneal cavity. Musculoskeletal: No aggressive appearing osseous lesions noted in the visualized portions of the skeleton. IMPRESSION: 1. Study is positive for cholelithiasis. However, there is no evidence of acute cholecystitis. Additionally, there is no evidence of choledocholithiasis or biliary tract obstruction. 2. Hepatomegaly with severe hepatic steatosis. Electronically Signed   By: Trudie Reed M.D.   On: 03/29/2020 07:13   US Abdomen Limited RUQ (LIVER/GB)  Result Date: 03/28/2020 CLINICAL DATA:  Right upper quadrant pain. EXAM: ULTRASOUND ABDOMEN LIMITED RIGHT UPPER QUADRANT COMPARISON:  None.  FINDINGS: Gallbladder: Partially distended. Multiple intraluminal gallstones. No gallbladder wall thickening or pericholecystic fluid. No sonographic Murphy sign noted by sonographer. Common bile duct: Diameter: 9 mm, dilated.  No visualized choledocholithiasis. Liver: Heterogeneously increased hepatic echogenicity. The liver parenchyma is difficult to penetrate. No evidence of focal lesion on this limited exam. Portal vein is patent on color Doppler imaging with normal direction of blood flow towards the liver. Other: No right upper quadrant ascites. IMPRESSION: 1. Gallstones without sonographic findings of acute cholecystitis. 2. Dilated common bile duct 9 mm. No visualized choledocholithiasis. Consider further evaluation with MRCP. MRCP should only be considered if patient is able to tolerate breath hold technique. 3. Hepatic steatosis. Electronically Signed   By: Narda Rutherford M.D.   On: 03/28/2020 16:10     Subjective: Eager to go home  Discharge  Exam: Vitals:   03/30/20 2024 03/31/20 0355  BP: (!) 150/82 122/77  Pulse: 84 64  Resp: 19 18  Temp: 98.8 F (37.1 C) 98.3 F (36.8 C)  SpO2: 97% 98%   Vitals:   03/30/20 1329 03/30/20 1804 03/30/20 2024 03/31/20 0355  BP: 124/73 131/69 (!) 150/82 122/77  Pulse: 75 86 84 64  Resp: 18 18 19 18   Temp: 98 F (36.7 C) 98.6 F (37 C) 98.8 F (37.1 C) 98.3 F (36.8 C)  TempSrc: Oral Oral Oral Oral  SpO2: 98% 97% 97% 98%  Weight:      Height:        General: Pt is alert, awake, not in acute distress Cardiovascular: RRR, S1/S2 +, no rubs, no gallops Respiratory: CTA bilaterally, no wheezing, no rhonchi Abdominal: Soft, NT, ND, bowel sounds + Extremities: no edema, no cyanosis   The results of significant diagnostics from this hospitalization (including imaging, microbiology, ancillary and laboratory) are listed below for reference.     Microbiology: Recent Results (from the past 240 hour(s))  Resp Panel by RT-PCR (Flu A&B, Covid) Nasopharyngeal Swab     Status: None   Collection Time: 03/28/20  5:51 PM   Specimen: Nasopharyngeal Swab; Nasopharyngeal(NP) swabs in vial transport medium  Result Value Ref Range Status   SARS Coronavirus 2 by RT PCR NEGATIVE NEGATIVE Final    Comment: (NOTE) SARS-CoV-2 target nucleic acids are NOT DETECTED.  The SARS-CoV-2 RNA is generally detectable in upper respiratory specimens during the acute phase of infection. The lowest concentration of SARS-CoV-2 viral copies this assay can detect is 138 copies/mL. A negative result does not preclude SARS-Cov-2 infection and should not be used as the sole basis for treatment or other patient management decisions. A negative result may occur with  improper specimen collection/handling, submission of specimen other than nasopharyngeal swab, presence of viral mutation(s) within the areas targeted by this assay, and inadequate number of viral copies(<138 copies/mL). A negative result must be combined  with clinical observations, patient history, and epidemiological information. The expected result is Negative.  Fact Sheet for Patients:  03/30/20  Fact Sheet for Healthcare Providers:  BloggerCourse.com  This test is no t yet approved or cleared by the [place] FDA and  has been authorized for detection and/or diagnosis of SARS-CoV-2 by FDA under an Emergency Use Authorization (EUA). This EUA will remain  in effect (meaning this test can be used) for the duration of the COVID-19 declaration under Section 564(b)(1) of the Act, 21 U.S.C.section 360bbb-3(b)(1), unless the authorization is terminated  or revoked sooner.       Influenza A by PCR NEGATIVE NEGATIVE Final  Influenza B by PCR NEGATIVE NEGATIVE Final    Comment: (NOTE) The Xpert Xpress SARS-CoV-2/FLU/RSV plus assay is intended as an aid in the diagnosis of influenza from Nasopharyngeal swab specimens and should not be used as a sole basis for treatment. Nasal washings and aspirates are unacceptable for Xpert Xpress SARS-CoV-2/FLU/RSV testing.  Fact Sheet for Patients: BloggerCourse.com  Fact Sheet for Healthcare Providers: SeriousBroker.it  This test is not yet approved or cleared by the [place] FDA and has been authorized for detection and/or diagnosis of SARS-CoV-2 by FDA under an Emergency Use Authorization (EUA). This EUA will remain in effect (meaning this test can be used) for the duration of the COVID-19 declaration under Section 564(b)(1) of the Act, 21 U.S.C. section 360bbb-3(b)(1), unless the authorization is terminated or revoked.  Performed at [facility], [address] [place], [place] [address]      Labs: BNP (last 3 results) No results for input(s): BNP in the last 8760 hours. Basic Metabolic Panel: Recent Labs  Lab 03/28/20 1532 03/29/20 0419 03/30/20 0343  03/30/20 1047 03/31/20 0339  NA 136 138 140 136 138  K 3.7 3.6 3.7 3.5 4.0  CL 103 103 102 101 103  CO2 GLUCOSE 97 103* 114* 161* 126*  BUN CREATININE 0.68 0.77 0.83 0.81 0.78  CALCIUM 8.6* 8.4* 8.5* 8.5* 8.5*   Liver Function Tests: Recent Labs  Lab 03/28/20 1532 03/29/20 0419 03/30/20 0343 03/30/20 1047 03/31/20 0339  AST 176* 107* 159* 234* 177*  ALT 149* 116* 147* 207* 209*  ALKPHOS 175* 152* 195* 223* 191*  BILITOT 3.0* 1.4* 2.4* 3.1* 1.2  PROT 7.2 6.3* 6.1* 6.9 6.7  ALBUMIN 3.8 3.3* 3.1* 3.4* 3.4*   Recent Labs  Lab 03/28/20 1532  LIPASE 23   No results for input(s): AMMONIA in the last 168 hours. CBC: Recent Labs  Lab 03/28/20 1532 03/30/20 0343 03/30/20 1047  WBC 7.5 6.6 8.8  HGB 13.5 11.7* 12.4  HCT 41.7 37.8 39.5  MCV 79.4* 82.2 81.4  PLT 205 181 190   Cardiac Enzymes: No results for input(s): CKTOTAL, CKMB, CKMBINDEX, TROPONINI in the last 168 hours. BNP: Invalid input(s): POCBNP CBG: No results for input(s): GLUCAP in the last 168 hours. D-Dimer No results for input(s): DDIMER in the last 72 hours. Hgb A1c No results for input(s): HGBA1C in the last 72 hours. Lipid Profile No results for input(s): CHOL, HDL, LDLCALC, TRIG, CHOLHDL, LDLDIRECT in the last 72 hours. Thyroid function studies No results for input(s): TSH, T4TOTAL, T3FREE, THYROIDAB in the last 72 hours.  Invalid input(s): FREET3 Anemia work up No results for input(s): VITAMINB12, FOLATE, FERRITIN, TIBC, IRON, RETICCTPCT in the last 72 hours. Urinalysis    Component Value Date/Time   COLORURINE YELLOW 03/28/2020 1532   APPEARANCEUR HAZY (A) 03/28/2020 1532   LABSPEC 1.010 03/28/2020 1532   PHURINE 7.5 03/28/2020 1532   GLUCOSEU NEGATIVE 03/28/2020 1532   HGBUR NEGATIVE 03/28/2020 1532   BILIRUBINUR SMALL (A) 03/28/2020 1532   KETONESUR NEGATIVE 03/28/2020 1532   PROTEINUR NEGATIVE 03/28/2020 1532   NITRITE NEGATIVE 03/28/2020 1532    LEUKOCYTESUR NEGATIVE 03/28/2020 1532   Sepsis Labs Invalid input(s): PROCALCITONIN,  WBC,  LACTICIDVEN Microbiology Recent Results (from the past 240 hour(s))  Resp Panel by RT-PCR (Flu A&B, Covid) Nasopharyngeal Swab     Status: None   Collection Time: 03/28/20  5:51 PM   Specimen: Nasopharyngeal Swab; Nasopharyngeal(NP) swabs  in vial transport medium  Result Value Ref Range Status   SARS Coronavirus 2 by RT PCR NEGATIVE NEGATIVE Final    Comment: (NOTE) SARS-CoV-2 target nucleic acids are NOT DETECTED.  The SARS-CoV-2 RNA is generally detectable in upper respiratory specimens during the acute phase of infection. The lowest concentration of SARS-CoV-2 viral copies this assay can detect is 138 copies/mL. A negative result does not preclude SARS-Cov-2 infection and should not be used as the sole basis for treatment or other patient management decisions. A negative result may occur with  improper specimen collection/handling, submission of specimen other than nasopharyngeal swab, presence of viral mutation(s) within the areas targeted by this assay, and inadequate number of viral copies(<138 copies/mL). A negative result must be combined with clinical observations, patient history, and epidemiological information. The expected result is Negative.  Fact Sheet for Patients:  BloggerCourse.com  Fact Sheet for Healthcare Providers:  [employer]iousBroker.it  This test is no t yet approved or cleared by the [place] FDA and  has been authorized for detection and/or diagnosis of SARS-CoV-2 by FDA under an Emergency Use Authorization (EUA). This EUA will remain  in effect (meaning this test can be used) for the duration of the COVID-19 declaration under Section 564(b)(1) of the Act, 21 U.S.C.section 360bbb-3(b)(1), unless the authorization is terminated  or revoked sooner.       Influenza A by PCR NEGATIVE NEGATIVE Final   Influenza B  by PCR NEGATIVE NEGATIVE Final    Comment: (NOTE) The Xpert Xpress SARS-CoV-2/FLU/RSV plus assay is intended as an aid in the diagnosis of influenza from Nasopharyngeal swab specimens and should not be used as a sole basis for treatment. Nasal washings and aspirates are unacceptable for Xpert Xpress SARS-CoV-2/FLU/RSV testing.  Fact Sheet for Patients: BloggerCourse.com  Fact Sheet for Healthcare Providers: SeriousBroker.it  This test is not yet approved or cleared by the [place] FDA and has been authorized for detection and/or diagnosis of SARS-CoV-2 by FDA under an Emergency Use Authorization (EUA). This EUA will remain in effect (meaning this test can be used) for the duration of the COVID-19 declaration under Section 564(b)(1) of the Act, 21 U.S.C. section 360bbb-3(b)(1), unless the authorization is terminated or revoked.  Performed at [facility], [address], [place], [place] [address]    Time spent: 30 min  SIGNED:   Rickey Barbara, MD  [facility] 03/31/2020, 12:03 PM  If 7PM-7AM, please contact night-coverage

## 2020-03-31 NOTE — Progress Notes (Signed)
Pt will go home this afternoon with her spouse. Alert and oriented. Discharge instructions given/explained with pt verbalizing understanding. Pt aware of followup appointments.

## 2020-03-31 NOTE — Plan of Care (Signed)
Pt ready to go home. Care plan objectives met.

## 2020-04-02 ENCOUNTER — Other Ambulatory Visit: Payer: Self-pay

## 2020-04-16 ENCOUNTER — Other Ambulatory Visit: Payer: Self-pay

## 2020-04-17 LAB — SURGICAL PATHOLOGY

## 2020-05-27 ENCOUNTER — Emergency Department (HOSPITAL_BASED_OUTPATIENT_CLINIC_OR_DEPARTMENT_OTHER)
Admission: EM | Admit: 2020-05-27 | Discharge: 2020-05-28 | Disposition: A | Discharge status: Home / Self Care | Payer: 59 | Attending: Emergency Medicine | Admitting: Emergency Medicine

## 2020-05-27 ENCOUNTER — Other Ambulatory Visit: Payer: Self-pay

## 2020-05-27 ENCOUNTER — Encounter (HOSPITAL_BASED_OUTPATIENT_CLINIC_OR_DEPARTMENT_OTHER): Payer: Self-pay | Admitting: Emergency Medicine

## 2020-05-27 ENCOUNTER — Emergency Department (HOSPITAL_BASED_OUTPATIENT_CLINIC_OR_DEPARTMENT_OTHER): Payer: 59

## 2020-05-27 DIAGNOSIS — R110 Nausea: Secondary | ICD-10-CM

## 2020-05-27 DIAGNOSIS — K529 Noninfective gastroenteritis and colitis, unspecified: Secondary | ICD-10-CM | POA: Insufficient documentation

## 2020-05-27 DIAGNOSIS — E039 Hypothyroidism, unspecified: Secondary | ICD-10-CM | POA: Insufficient documentation

## 2020-05-27 DIAGNOSIS — R1010 Upper abdominal pain, unspecified: Secondary | ICD-10-CM | POA: Diagnosis present

## 2020-05-27 DIAGNOSIS — R1084 Generalized abdominal pain: Secondary | ICD-10-CM

## 2020-05-27 DIAGNOSIS — Z79899 Other long term (current) drug therapy: Secondary | ICD-10-CM | POA: Diagnosis not present

## 2020-05-27 DIAGNOSIS — A09 Infectious gastroenteritis and colitis, unspecified: Secondary | ICD-10-CM

## 2020-05-27 LAB — URINALYSIS, ROUTINE W REFLEX MICROSCOPIC
Bilirubin Urine: NEGATIVE
Glucose, UA: NEGATIVE mg/dL
Ketones, ur: NEGATIVE mg/dL
Leukocytes,Ua: NEGATIVE
Nitrite: NEGATIVE
Protein, ur: NEGATIVE mg/dL
Specific Gravity, Urine: 1.01 (ref 1.005–1.030)
pH: 6.5 (ref 5.0–8.0)

## 2020-05-27 LAB — COMPREHENSIVE METABOLIC PANEL
ALT: 20 U/L (ref 0–44)
AST: 26 U/L (ref 15–41)
Albumin: 3.5 g/dL (ref 3.5–5.0)
Alkaline Phosphatase: 73 U/L (ref 38–126)
Anion gap: 8 (ref 5–15)
BUN: 7 mg/dL (ref 6–20)
CO2: 26 mmol/L (ref 22–32)
Calcium: 8.3 mg/dL — ABNORMAL LOW (ref 8.9–10.3)
Chloride: 103 mmol/L (ref 98–111)
Creatinine, Ser: 0.84 mg/dL (ref 0.44–1.00)
GFR, Estimated: >60 mL/min (ref >60)
Glucose, Bld: 105 mg/dL — ABNORMAL HIGH (ref 70–99)
Potassium: 3.2 mmol/L — ABNORMAL LOW (ref 3.5–5.1)
Sodium: 137 mmol/L (ref 135–145)
Total Bilirubin: 0.8 mg/dL (ref 0.3–1.2)
Total Protein: 6.7 g/dL (ref 6.5–8.1)

## 2020-05-27 LAB — CBC
HCT: 40.4 % (ref 36.0–46.0)
Hemoglobin: 12.8 g/dL (ref 12.0–15.0)
MCH: 25.8 pg — ABNORMAL LOW (ref 26.0–34.0)
MCHC: 31.7 g/dL (ref 30.0–36.0)
MCV: 81.3 fL (ref 80.0–100.0)
Platelets: 207 10*3/uL (ref 150–400)
RBC: 4.97 MIL/uL (ref 3.87–5.11)
RDW: 14.9 % (ref 11.5–15.5)
WBC: 7.7 10*3/uL (ref 4.0–10.5)
nRBC: 0 % (ref 0.0–0.2)

## 2020-05-27 LAB — URINALYSIS, MICROSCOPIC (REFLEX): WBC, UA: NONE SEEN WBC/hpf (ref 0–5)

## 2020-05-27 LAB — PREGNANCY, URINE: Preg Test, Ur: NEGATIVE

## 2020-05-27 LAB — LIPASE, BLOOD: Lipase: 29 U/L (ref 11–51)

## 2020-05-27 MED ORDER — IOHEXOL 350 MG/ML SOLN
100.0000 mL | Freq: Once | INTRAVENOUS | Status: AC | PRN
  Administered 2020-05-27: 100 mL via INTRAVENOUS

## 2020-05-27 MED ORDER — ONDANSETRON HCL 4 MG PO TABS: 4.0000 mg | ORAL_TABLET | Freq: Three times a day (TID) | ORAL | 0 refills | Status: AC | PRN

## 2020-05-27 MED ORDER — MORPHINE SULFATE (PF) 4 MG/ML IV SOLN
4.0000 mg | Freq: Once | INTRAVENOUS | Status: AC
Start: 2020-05-27 — End: 2020-05-27
  Administered 2020-05-27: 4 mg via INTRAVENOUS
  Filled 2020-05-27: qty 1

## 2020-05-27 MED ORDER — SODIUM CHLORIDE 0.9 % IV BOLUS
1000.0000 mL | Freq: Once | INTRAVENOUS | Status: AC
  Administered 2020-05-27: 1000 mL via INTRAVENOUS

## 2020-05-27 MED ORDER — ONDANSETRON HCL 4 MG/2ML IJ SOLN
4.0000 mg | Freq: Once | INTRAMUSCULAR | Status: AC
  Administered 2020-05-27: 4 mg via INTRAVENOUS
  Filled 2020-05-27: qty 2

## 2020-05-27 NOTE — ED Provider Notes (Signed)
[facility] EMERGENCY DEPARTMENT Provider Note   CSN: [id number] Arrival date & time: 05/27/20  1924     History Chief Complaint  Patient presents with  . Abdominal Pain    Atiya RUNETTE Esparza is a 40 y.o. female.  The history is provided by the patient and medical records. No language interpreter was used.  Abdominal Cramping This is a new problem. The current episode started yesterday. The problem occurs constantly. The problem has not changed since onset.Associated symptoms include abdominal pain. Pertinent negatives include no chest pain, no headaches and no shortness of breath. Nothing aggravates the symptoms. Nothing relieves the symptoms. She has tried nothing for the symptoms. The treatment provided no relief.       Past Medical History:  Diagnosis Date  . Anemia   . Depression   . GERD (gastroesophageal reflux disease)   . PCOS (polycystic ovarian syndrome)   . Thyroid disease   . Vitamin D deficiency     Patient Active Problem List   Diagnosis Date Noted  . PCOS (polycystic ovarian syndrome) 03/30/2020  . Vitamin D deficiency 03/30/2020  . Acute calculous cholecystitis s/p lap cholecystectomy 03/30/2020 03/30/2020  . Steatohepatitis, nonalcoholic 03/30/2020  . Depressive disorder 03/29/2020  . Biliary obstruction 03/28/2020  . Snoring 05/26/2018  . Gastro-esophageal reflux disease without esophagitis 08/30/2017  . Aphonia 08/30/2017  . Referred ear pain, right 08/30/2017  . Overactive bladder 11/17/2016  . Morbid obesity with BMI of 60.0-69.9, adult (HCC) 05/14/2016  . Iron deficiency anemia, unspecified 05/07/2014  . Elevated LFTs 04/17/2014  . Major depressive disorder, single episode 06/25/2013  . Other abnormal glucose 06/25/2013  . Hypothyroidism 06/23/2013  . Hyperlipidemia, unspecified 06/23/2013  . Malaise and fatigue 06/23/2013    Past Surgical History:  Procedure Laterality Date  . CESAREAN SECTION    . CHOLECYSTECTOMY N/A  03/30/2020   Procedure: LAPAROSCOPIC CHOLECYSTECTOMY WITH INTRAOPERATIVE CHOLANGIOGRAM, AND LIVER BIOPSY;  Surgeon: Karie Soda, MD;  Location: [facility] ORS;  Service: General;  Laterality: N/A;  . WISDOM TOOTH EXTRACTION       OB History   No obstetric history on file.     History reviewed. No pertinent family history.  Social History   Tobacco Use  . Smoking status: Never Smoker  . Smokeless tobacco: Never Used  Vaping Use  . Vaping Use: Never used  Substance Use Topics  . Alcohol use: Yes    Comment: rare  . Drug use: Never    Home Medications Prior to Admission medications   Medication Sig Start Date End Date Taking? Authorizing Provider  levothyroxine (SYNTHROID) 112 MCG tablet Take 224 mcg by mouth daily before breakfast.    [provider]  oxyCODONE (OXY IR/ROXICODONE) 5 MG immediate release tablet Take 1 tablet (5 mg total) by mouth every 4 (four) hours as needed for moderate pain. 03/31/20   Karie Soda, MD  pantoprazole (PROTONIX) 40 MG tablet Take 40 mg by mouth daily.    [provider]  sertraline (ZOLOFT) 50 MG tablet Take 150 mg by mouth daily.    [provider]    Allergies    Penicillins  Review of Systems   Review of Systems  Constitutional: Positive for fatigue. Negative for chills, diaphoresis and fever.  HENT: Negative for congestion.   Eyes: Negative for visual disturbance.  Respiratory: Negative for cough, chest tightness, shortness of breath and wheezing.   Cardiovascular: Negative for chest pain and palpitations.  Gastrointestinal: Positive for abdominal pain, diarrhea and nausea. Negative  for constipation and vomiting.  Genitourinary: Negative for dysuria, flank pain and frequency.  Musculoskeletal: Negative for back pain, neck pain and neck stiffness.  Skin: Negative for rash and wound.  Neurological: Negative for dizziness, weakness, light-headedness and headaches.  Psychiatric/Behavioral: Negative for agitation  and confusion.  All other systems reviewed and are negative.   Physical Exam Updated Vital Signs BP 122/60 (BP Location: Right Arm)   Pulse 85   Temp 98.4 F (36.9 C) (Oral)   Resp 18   Ht 5\' 5"  (1.651 m)   Wt (!) 158.8 kg   LMP 05/22/2020 (Exact Date)   SpO2 97%   BMI 58.24 kg/m   Physical Exam Vitals and nursing note reviewed.  Constitutional:      General: She is not in acute distress.    Appearance: She is well-developed and well-nourished. She is not ill-appearing, toxic-appearing or diaphoretic.  HENT:     Head: Normocephalic and atraumatic.     Mouth/Throat:     Mouth: Mucous membranes are dry.     Pharynx: No posterior oropharyngeal erythema.  Eyes:     Conjunctiva/sclera: Conjunctivae normal.  Cardiovascular:     Rate and Rhythm: Normal rate and regular rhythm.     Heart sounds: No murmur heard.   Pulmonary:     Effort: Pulmonary effort is normal. No respiratory distress.     Breath sounds: Normal breath sounds.  Abdominal:     General: Abdomen is flat. Bowel sounds are normal.     Palpations: Abdomen is soft.     Tenderness: There is abdominal tenderness in the right upper quadrant, epigastric area and left upper quadrant. There is no right CVA tenderness, left CVA tenderness, guarding or rebound.  Musculoskeletal:        General: No edema.     Cervical back: Neck supple.  Skin:    General: Skin is warm and dry.     Capillary Refill: Capillary refill takes less than 2 seconds.     Coloration: Skin is not pale.     Findings: No rash.  Neurological:     General: No focal deficit present.     Mental Status: She is alert.  Psychiatric:        Mood and Affect: Mood and affect and mood normal.     ED Results / Procedures / Treatments   Labs (all labs ordered are listed, but only abnormal results are displayed) Labs Reviewed  COMPREHENSIVE METABOLIC PANEL - Abnormal; Notable for the following components:      Result Value   Potassium 3.2 (*)     Glucose, Bld 105 (*)    Calcium 8.3 (*)    All other components within normal limits  CBC - Abnormal; Notable for the following components:   MCH 25.8 (*)    All other components within normal limits  URINALYSIS, ROUTINE W REFLEX MICROSCOPIC - Abnormal; Notable for the following components:   Color, Urine AMBER (*)    APPearance HAZY (*)    Hgb urine dipstick LARGE (*)    All other components within normal limits  URINALYSIS, MICROSCOPIC (REFLEX) - Abnormal; Notable for the following components:   Bacteria, UA RARE (*)    All other components within normal limits  LIPASE, BLOOD  PREGNANCY, URINE    EKG None  Radiology CT ABDOMEN PELVIS W CONTRAST  Result Date: 05/27/2020 CLINICAL DATA:  Abdominal pain, acute, nonlocalized Recent abdominal surgery, severe pain in upper abdomen with nausea and diarrhea. EXAM: CT ABDOMEN  AND PELVIS WITH CONTRAST TECHNIQUE: Multidetector CT imaging of the abdomen and pelvis was performed using the standard protocol following bolus administration of intravenous contrast. CONTRAST:  OMNIPAQUE IOHEXOL 350 MG/ML SOLN COMPARISON:  None. FINDINGS: Lower chest: No acute abnormality. Hepatobiliary: Enlarged liver measuring up to 24cm. The hepatic parenchyma is diffusely hypodense compared to the splenic parenchyma consistent with fatty infiltration. No focal liver abnormality. Status post cholecystectomy. No biliary dilatation. Pancreas: No focal lesion. Normal pancreatic contour. No surrounding inflammatory changes. No main pancreatic ductal dilatation. Spleen: The spleen is enlarged measuring up to 14cm. No focal abnormality. Adrenals/Urinary Tract: No adrenal nodule bilaterally. Bilateral kidneys enhance symmetrically. No hydronephrosis. No hydroureter. The urinary bladder is unremarkable. Stomach/Bowel: Stomach is within normal limits. Mid small bowel wall thickening. No pneumatosis. No small bowel dilatation. No evidence of large bowel wall thickening or  dilatation. Appendix appears normal. Vascular/Lymphatic: No abdominal aorta or iliac aneurysm. No abdominal, pelvic, or inguinal lymphadenopathy. Reproductive: Uterus and bilateral adnexa are unremarkable. Other: Edema and fat stranding surrounding the superior mesenteric vessels. Associated trace free fluid. No intraperitoneal free gas. No organized fluid collection. Musculoskeletal: Tiny fat containing umbilical hernia. No acute or significant osseous findings. IMPRESSION: 1. Mid small bowel enteritis. No definite findings to suggest bowel ischemia or perforation. Differential diagnosis for etiology includes infectious, inflammatory, and ischemic. 2. Hepatosplenomegaly. Electronically Signed   By: Tish Frederickson M.D.   On: 05/27/2020 23:36    Procedures Procedures     Medications Ordered in ED Medications  morphine 4 MG/ML injection 4 mg (4 mg Intravenous Given 05/27/20 2256)  ondansetron (ZOFRAN) injection 4 mg (4 mg Intravenous Given 05/27/20 2256)  sodium chloride 0.9 % bolus 1,000 mL (0 mLs Intravenous Stopped 05/28/20 0011)  iohexol (OMNIPAQUE) 350 MG/ML injection 100 mL (100 mLs Intravenous Contrast Given 05/27/20 2305)    ED Course  I have reviewed the triage vital signs and the nursing notes.  Pertinent labs & imaging results that were available during my care of the patient were reviewed by me and considered in my medical decision making (see chart for details).    MDM Rules/Calculators/A&P                          Laylah LINN CLAVIN is a 40 y.o. female with a past medical history significant for GERD, fatty liver, PCOS, hyperlipidemia, and recent gallbladder removal who presents with nausea, diarrhea, and abdominal pain.  Patient reports that her son has had numerous people with gastroenteritis and GI bugs at school but he has not had significant symptoms.  She says that yesterday, she went to a party and had some food but following this started having nausea and diarrhea.  She  reports has had abdominal cramping overnight throughout the day.  She reports she is not having any vomiting but is concerned given her previous abdominal surgery.  She reports the pain is waxing and waning but is severe.  She denies any constipation.  She denies any urinary changes.  She denies any trauma.  She denies any blood in her stool.  She does not think any other people from the party had similar symptoms.  On exam, lungs are clear and chest is nontender.  Abdomen is slightly tender in the upper abdomen.  Normal bowel sounds were appreciated.  Exam otherwise unremarkable.  Had a shared decision-making conversation with patient and she does want CT imaging of her abdomen given the severe pain near where her  surgery was several months ago.  She will be given some fluids, pain medicine, nausea medicine.  She will have screening labs as well.  Patient's labs were overall reassuring.  Her previous elevated liver function was normal and her kidney function was normal.  Mild hypokalemia likely from the diarrhea.  CBC shows no leukocytosis or anemia.  Urinalysis does not show infection.  Patient had CT scan showing enteritis.  I suspect a gastroenteritis given the report of exposures to numerous people with gastroenteritis recently.  Low suspicion for a bacterial enteritis or ischemic enteritis based on the description of symptoms.  Patient was feeling better after fluids and medications.  She will be given prescription for nausea medicine and will follow up with a PCP in the next several days for reassessment.  She agreed with plan of care and return precautions.  She no other worsens or concerns and was discharged in good condition.   Final Clinical Impression(s) / ED Diagnoses Final diagnoses:  Nausea  Diarrhea of infectious origin  Enteritis  Generalized abdominal pain    Rx / DC Orders ED Discharge Orders         Ordered    ondansetron (ZOFRAN) 4 MG tablet  Every 8 hours PRN         05/27/20 2354         Clinical Impression: 1. Nausea   2. Diarrhea of infectious origin   3. Enteritis   4. Generalized abdominal pain     Disposition: Discharge  Condition: Good  I have discussed the results, Dx and Tx plan with the pt(& family if present). He/she/they expressed understanding and agree(s) with the plan. Discharge instructions discussed at great length. Strict return precautions discussed and pt &/or family have verbalized understanding of the instructions. No further questions at time of discharge.    Discharge Medication List as of 05/27/2020 11:55 PM    START taking these medications   Details  ondansetron (ZOFRAN) 4 MG tablet Take 1 tablet (4 mg total) by mouth every 8 (eight) hours as needed for nausea or vomiting., Starting Mon 05/27/2020, Print        Follow Up: Alfonso Patten, MD [address] [place] [address] [phone]        Rickayla Wieland, Canary Brim, MD 05/28/20 [phone]

## 2020-05-27 NOTE — Discharge Instructions (Signed)
Your history, exam, work-up today are consistent with a likely viral gastroenteritis causing your nausea, diarrhea, and abdominal discomfort.  The CT scan today showed a mid enteritis with no evidence of perforation or convincing evidence of ischemia.  There was no obstruction or abscess.  Nothing was seen on there to suggest an acute bacterial infection.  Your labs were reassuring.  Please rest and stay hydrated.  Please follow-up with your primary doctor.  Please use the nausea medicine to help and if any symptoms change or worsen acutely, please return to the nearest emergency department.

## 2020-05-27 NOTE — ED Triage Notes (Signed)
Pt is c/o abd pain that started this afternoon  States has not had any vomiting or fever  Pt states she has had diarrhea since yesterday afternoon  Pt has nausea  States did eat some chicken noodle soup today

## 2020-05-27 NOTE — ED Notes (Signed)
Pt returned from CT °

## 2021-12-23 IMAGING — US US ABDOMEN LIMITED
1 series · 14 of 25 positions shown · non-contrast
Comparison: None.

CLINICAL DATA: Right upper quadrant pain.

EXAM:
ULTRASOUND ABDOMEN LIMITED RIGHT UPPER QUADRANT

[Series 1: us abdomen limited · 14 of 31 slices shown]
[im 1/31]
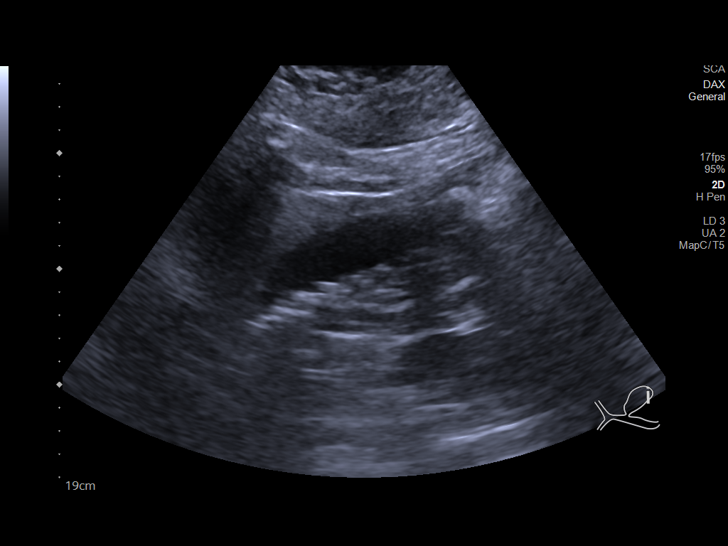
[im 3/31]
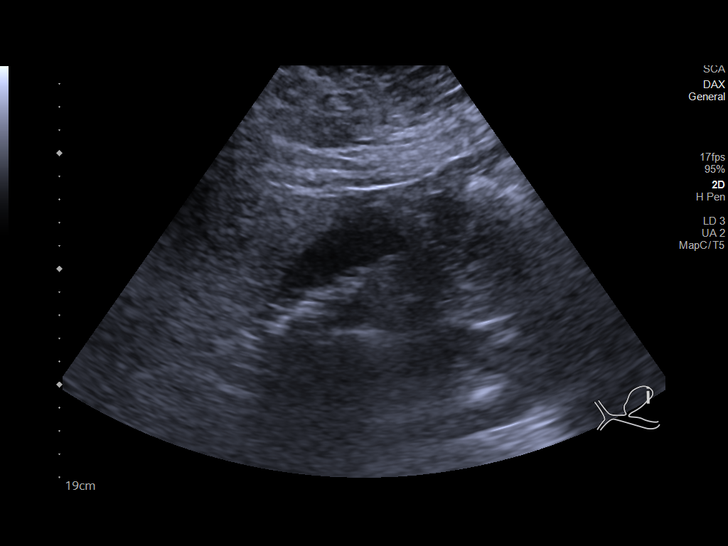
[im 6/31]
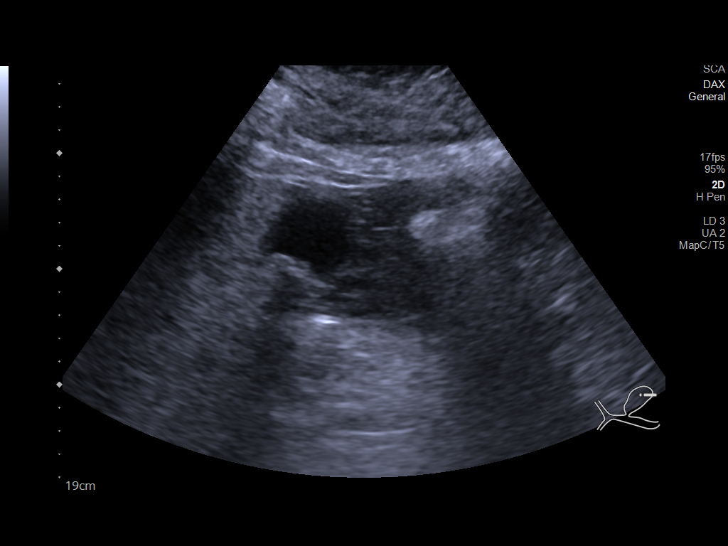
[im 8/31]
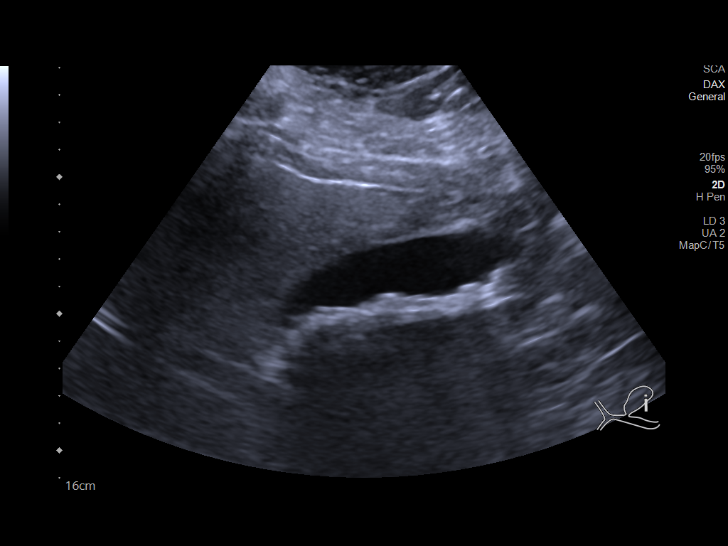
[im 11/31]
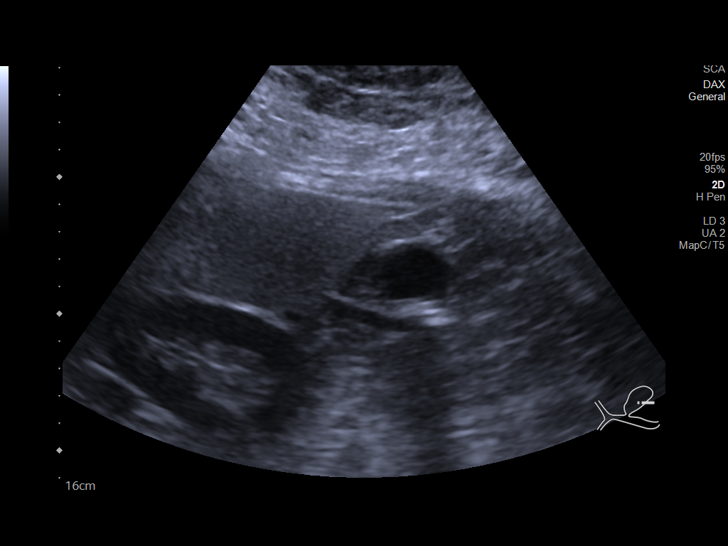
[im 12/31]
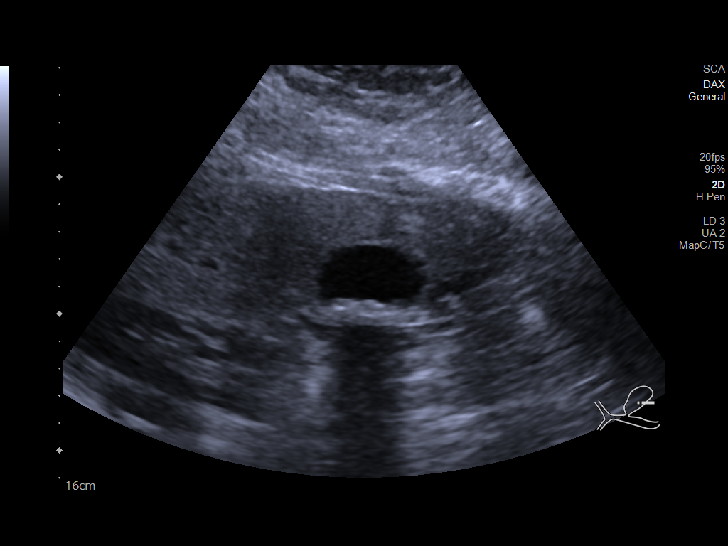
[im 14/31]
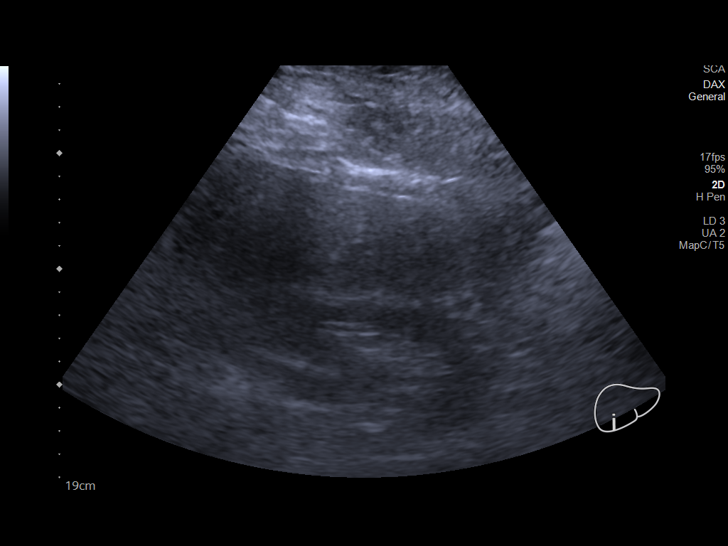
[im 17/31]
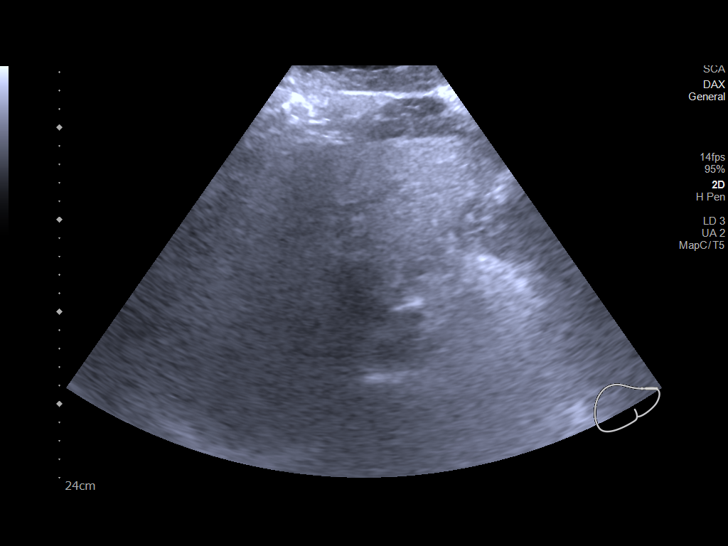
[im 19/31]
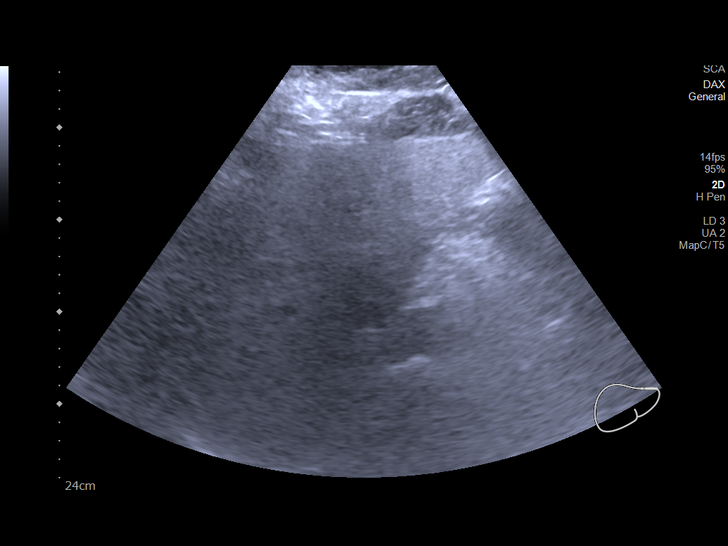
[im 21/31]
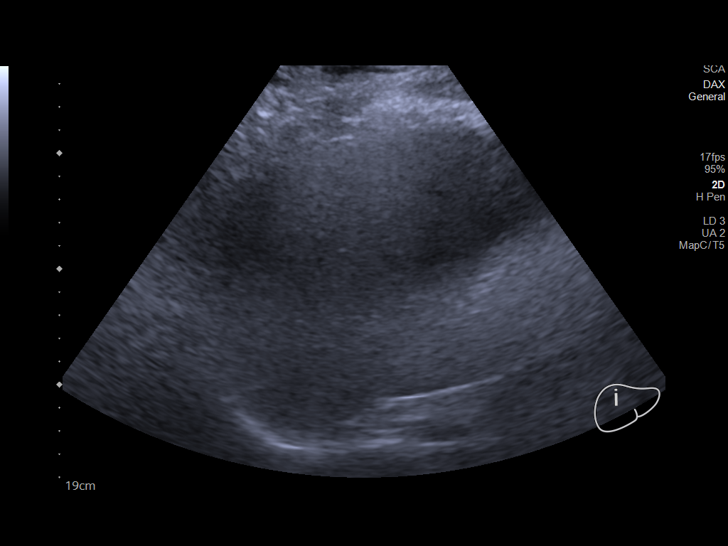
[im 23/31]
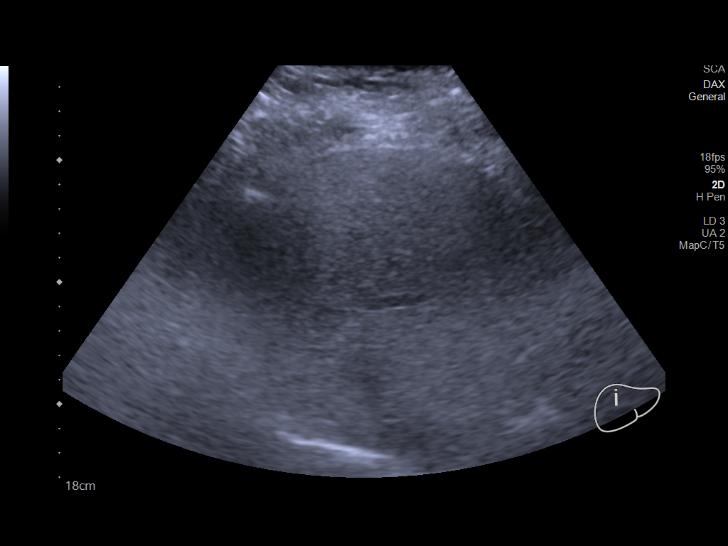
[im 26/31]
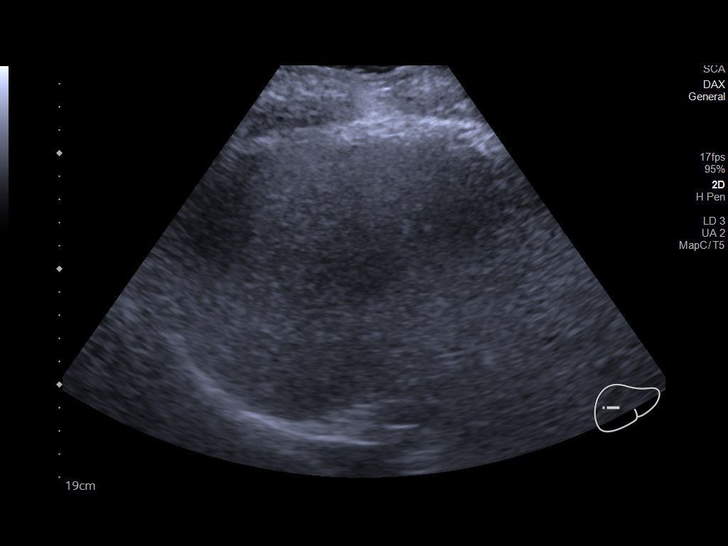
[im 28/31]
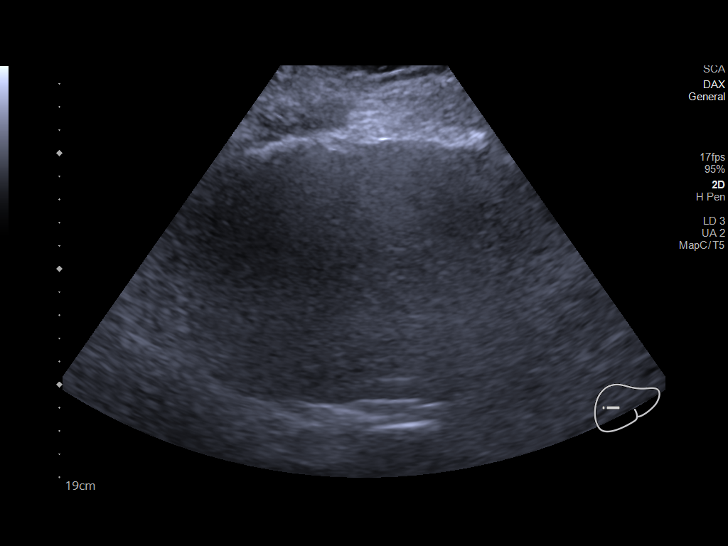
[im 31/31]
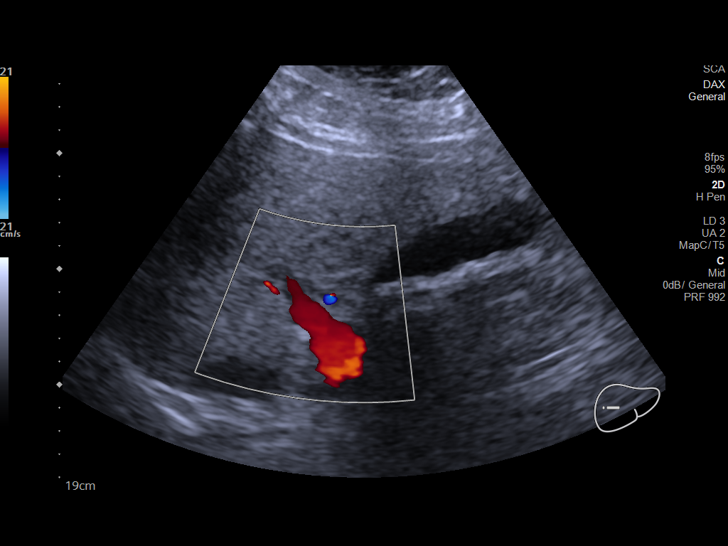

[14 of 25 positions shown; findings below may reference images not displayed]

FINDINGS: Gallbladder:

Partially distended. Multiple intraluminal gallstones. No
gallbladder wall thickening or pericholecystic fluid. No
sonographic Murphy sign noted by sonographer.

Common bile duct:

Diameter: 9 mm, dilated.  No visualized choledocholithiasis.

Liver:

Heterogeneously increased hepatic echogenicity. The liver parenchyma
is difficult to penetrate. No evidence of focal lesion on this
limited exam. Portal vein is patent on color Doppler imaging with
normal direction of blood flow towards the liver.

Other: No right upper quadrant ascites.
IMPRESSION: 1. Gallstones without sonographic findings of acute cholecystitis.
2. Dilated common bile duct 9 mm. No visualized choledocholithiasis.
Consider further evaluation with MRCP. MRCP should only be
considered if patient is able to tolerate breath hold technique.
3. Hepatic steatosis.

## 2021-12-24 IMAGING — MR MR ABDOMEN WO/W CM MRCP
17 of 21 series · 35 of 48 positions shown · IV contrast (gadavist)
Comparison: No prior abdominal MRI. Abdominal ultrasound
03/28/2020.

CLINICAL DATA: 39-year-old female with history of right upper
quadrant abdominal pain. Possible common bile duct dilatation noted
on recent ultrasound examination.

EXAM:
MRI ABDOMEN WITHOUT AND WITH CONTRAST (INCLUDING MRCP)
TECHNIQUE: Multiplanar multisequence MR imaging of the abdomen was performed
both before and after the administration of intravenous contrast.
Heavily T2-weighted images of the biliary and pancreatic ducts were
obtained, and three-dimensional MRCP images were rendered by post
processing.
CONTRAST:  10mL GADAVIST GADOBUTROL 1 MMOL/ML IV SOLN

[Series 3: T2 fat-sat · axial · 6.0mm · 1.41mm/px · 1 of 41 slices shown]
[im 1/41]
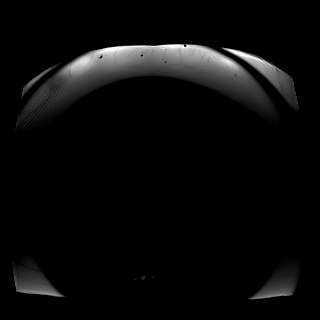

[Series 7: cor_3d_spc_trig · coronal · 1.0mm · 0.55mm/px · 3 of 88 slices shown]
[im 1/88]
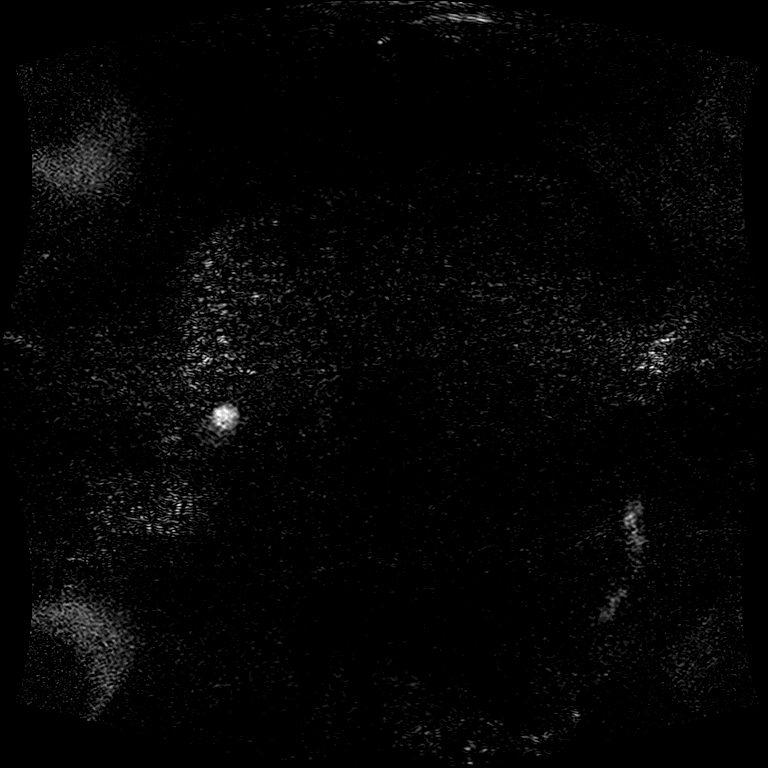
[im 44/88]
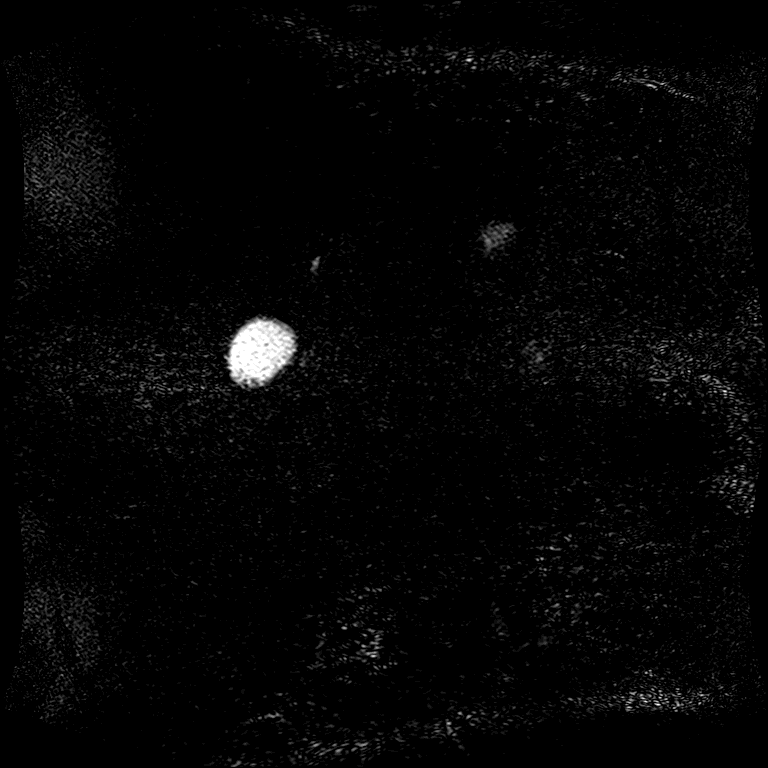
[im 88/88]
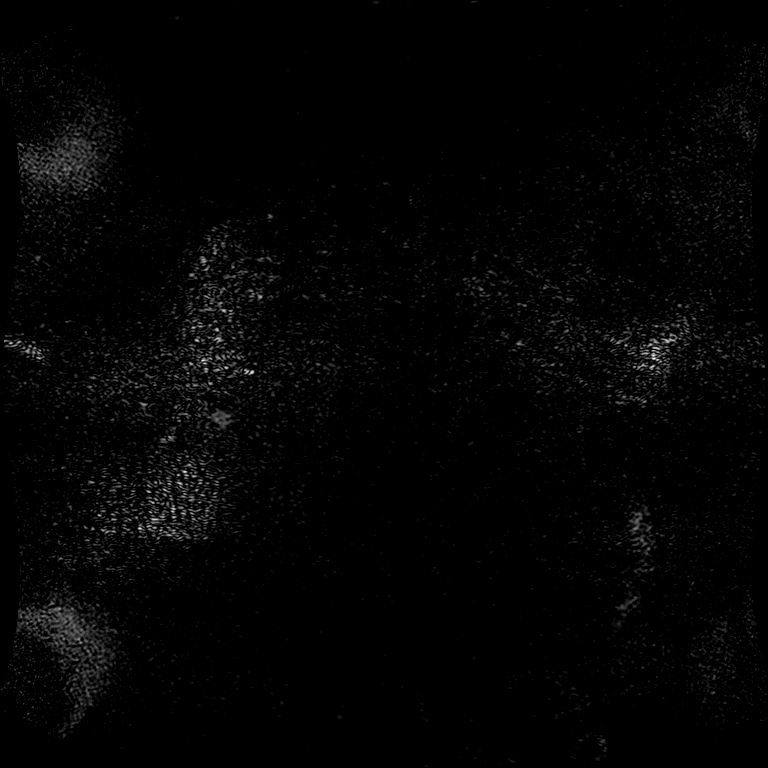

[Series 9: T2 · coronal · 6.0mm · 1.68mm/px · 1 of 34 slices shown (1 of 2)]
[im 1/34]
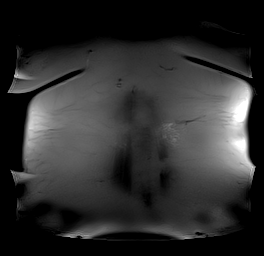

[Series 11: DWI · axial · 6.0mm · 1.64mm/px · z∈[-108,+194]mm · 3 of 86 slices shown (1 of 2)]
[im 1/86]
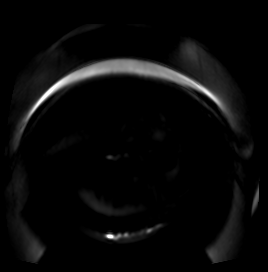
[im 43/86]
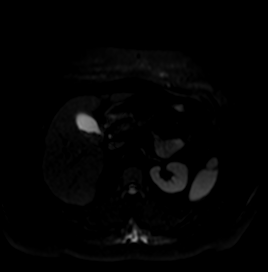
[im 86/86]
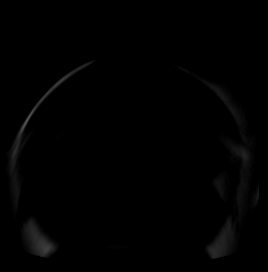

[Series 12: DWI · axial · 6.0mm · 1.64mm/px · 1 of 43 slices shown (2 of 2)]
[im 1/43]
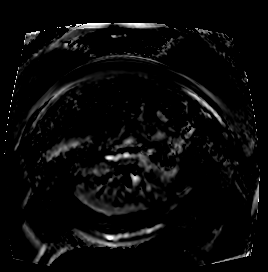

[Series 13: T1 · axial · 4.0mm · 1.38mm/px · z∈[-84,+200]mm · 2 of 72 slices shown (1 of 2)]
[im 1/72]
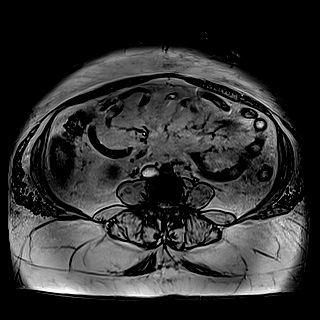
[im 72/72]
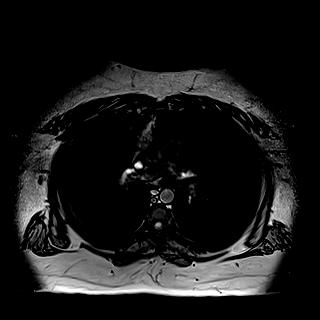

[Series 14: T1 · axial · 4.0mm · 1.38mm/px · z∈[-84,+200]mm · 2 of 72 slices shown (2 of 2)]
[im 1/72]
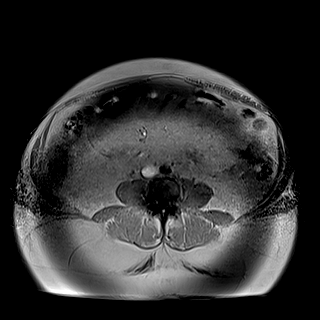
[im 72/72]
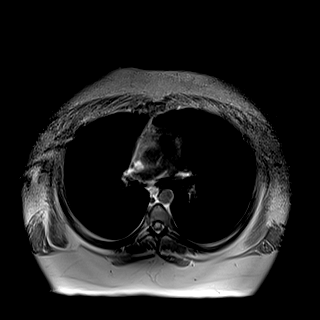

[Series 15: cor obl thk · sagittal · 50.0mm · 0.78mm/px · 1 of 9 slices shown]
[im 1/9]
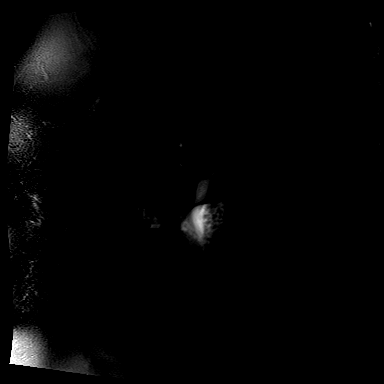

[Series 16: T2 · axial · 6.0mm · 1.72mm/px · 1 of 42 slices shown (2 of 2)]
[im 1/42]
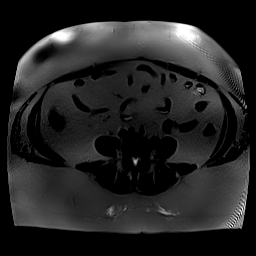

[Series 18: T1 dynamic · axial · 4.0mm · 1.38mm/px · z∈[-113,+203]mm · 3 of 80 slices shown (1 of 7)]
[im 1/80]
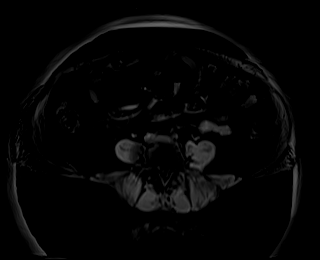
[im 40/80]
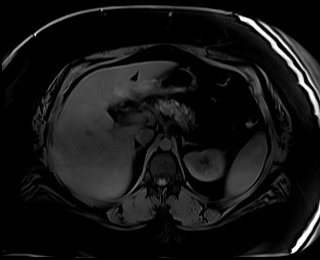
[im 80/80]
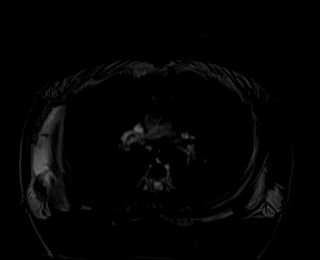

[Series 21: T1 dynamic · axial · 4.0mm · 1.38mm/px · z∈[-113,+203]mm · 3 of 80 slices shown (2 of 7)]
[im 1/80]
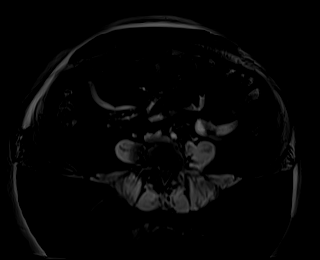
[im 40/80]
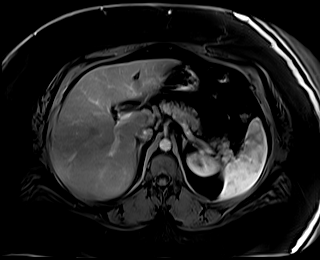
[im 80/80]
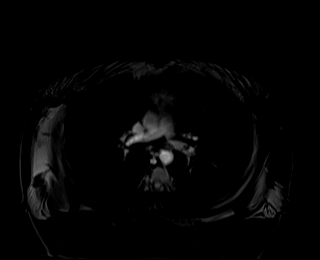

[Series 23: T1 dynamic · axial · 4.0mm · 1.38mm/px · z∈[-113,+203]mm · 3 of 80 slices shown (3 of 7)]
[im 1/80]
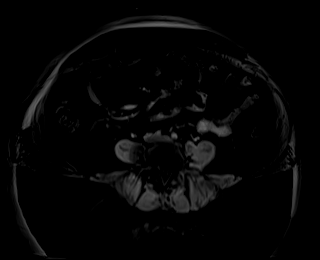
[im 40/80]
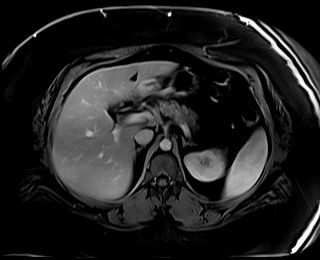
[im 80/80]
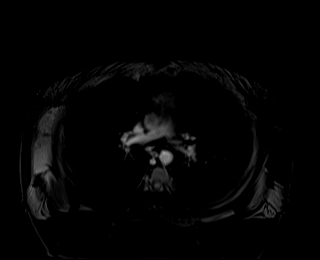

[Series 25: T1 dynamic · axial · 4.0mm · 1.38mm/px · z∈[-113,+203]mm · 3 of 80 slices shown (4 of 7)]
[im 1/80]
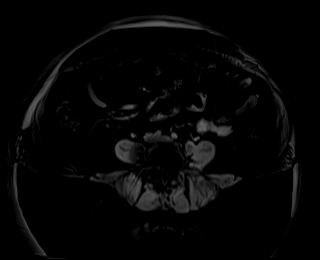
[im 40/80]
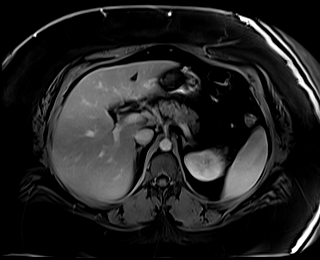
[im 80/80]
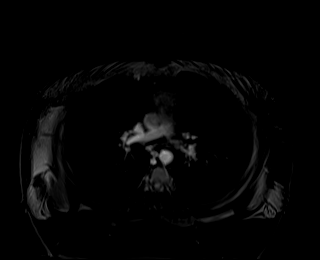

[Series 27: T1 dynamic · coronal · 3.0mm · 1.41mm/px · 2 of 72 slices shown (5 of 7)]
[im 1/72]
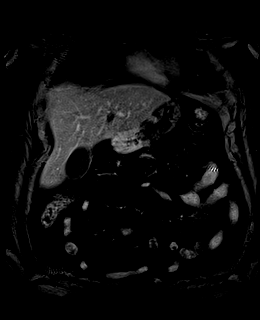
[im 72/72]
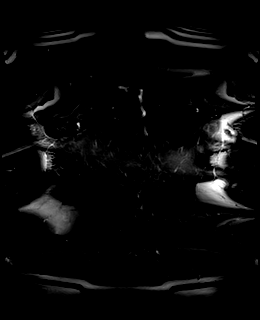

[Series 29: T1 dynamic · axial · 4.0mm · 1.38mm/px · z∈[-113,+203]mm · 3 of 80 slices shown (6 of 7)]
[im 1/80]
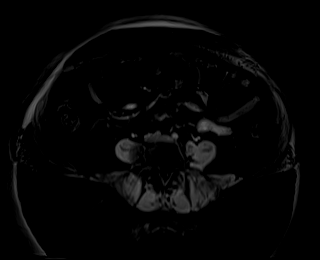
[im 40/80]
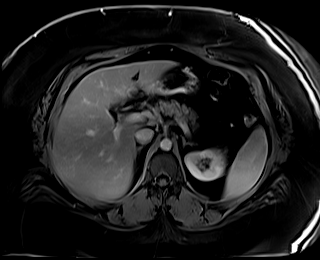
[im 80/80]
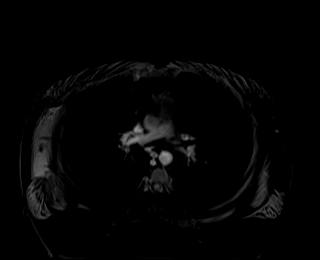

[Series 31: T1 dynamic · coronal · 5.0mm · 1.41mm/px · 2 of 60 slices shown (7 of 7)]
[im 1/60]
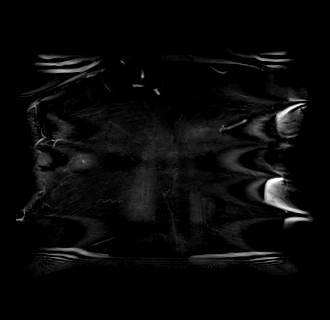
[im 60/60]
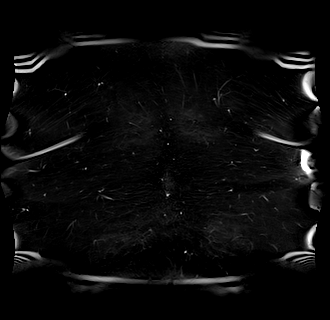

[Series 101: sub 20 sec · axial · 4.0mm · 1.38mm/px · 1 of 80 slices shown]
[im 1/80]
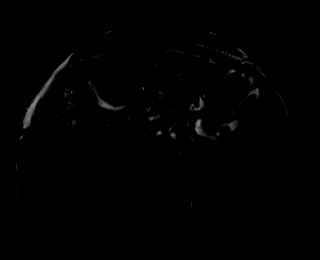

[35 of 48 positions shown; findings below may reference images not displayed]

FINDINGS: Lower chest: Unremarkable.

Hepatobiliary: Liver is enlarged measuring up to 23.2 cm in
craniocaudal span. Severe diffuse loss of signal intensity
throughout the hepatic parenchyma on out of phase dual echo images,
indicative of a background of severe hepatic steatosis. No
suspicious cystic or solid hepatic lesions. No intra or extrahepatic
biliary ductal dilatation noted on MRCP images. Common bile duct
measures up to 5 mm in the porta hepatis. No filling defect within
the common bile duct to suggest choledocholithiasis. Multiple
filling defects are noted within the gallbladder lying dependently,
compatible with small gallstones. Gallbladder is not distended.
Gallbladder wall thickness is normal. No pericholecystic fluid or
surrounding inflammatory changes.

Pancreas: No pancreatic mass. No pancreatic ductal dilatation noted
on MRCP images. No pancreatic or peripancreatic fluid collections or
inflammatory changes.

Spleen:  Unremarkable.

Adrenals/Urinary Tract: Subcentimeter T1 hypointense, T2
hyperintense, nonenhancing lesion in the lower pole of the left
kidney, compatible with a simple cyst. Right kidney and bilateral
adrenal glands are normal in appearance. No hydroureteronephrosis in
the visualized portions of the abdomen.

Stomach/Bowel: Visualized portions are unremarkable.

Vascular/Lymphatic: No aneurysm. No lymphadenopathy identified in
the visualized abdominal vasculature noted in the visualized
abdomen.

Other: No significant volume of ascites noted in the visualized
portions of the peritoneal cavity.

Musculoskeletal: No aggressive appearing osseous lesions noted in
the visualized portions of the skeleton.
IMPRESSION: 1. Study is positive for cholelithiasis. However, there is no
evidence of acute cholecystitis. Additionally, there is no evidence
of choledocholithiasis or biliary tract obstruction.
2. Hepatomegaly with severe hepatic steatosis.

## 2021-12-24 IMAGING — MR MR 3D RECON AT [facility]
17 of 21 series · 35 of 48 positions shown · IV contrast (gadavist)
Comparison: No prior abdominal MRI. Abdominal ultrasound
03/28/2020.

CLINICAL DATA: 39-year-old female with history of right upper
quadrant abdominal pain. Possible common bile duct dilatation noted
on recent ultrasound examination.

EXAM:
MRI ABDOMEN WITHOUT AND WITH CONTRAST (INCLUDING MRCP)
TECHNIQUE: Multiplanar multisequence MR imaging of the abdomen was performed
both before and after the administration of intravenous contrast.
Heavily T2-weighted images of the biliary and pancreatic ducts were
obtained, and three-dimensional MRCP images were rendered by post
processing.
CONTRAST:  10mL GADAVIST GADOBUTROL 1 MMOL/ML IV SOLN

[Series 3: T2 fat-sat · axial · 6.0mm · 1.41mm/px · 1 of 41 slices shown]
[im 1/41]
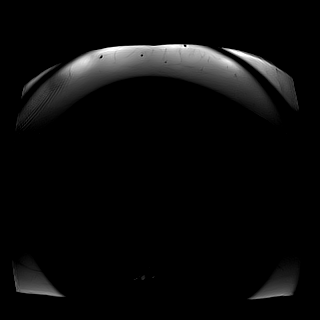

[Series 7: cor_3d_spc_trig · coronal · 1.0mm · 0.55mm/px · 3 of 88 slices shown]
[im 1/88]
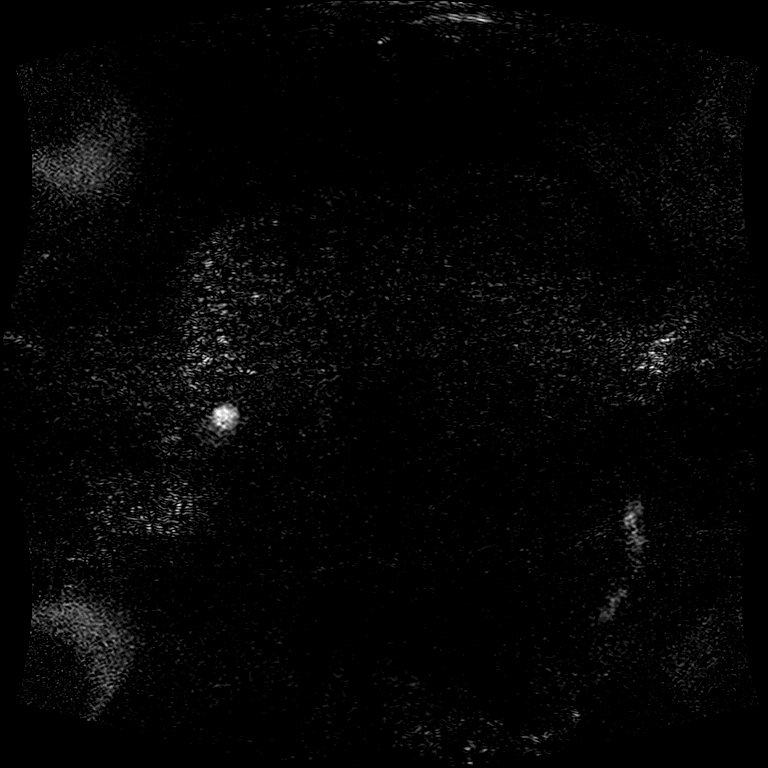
[im 44/88]
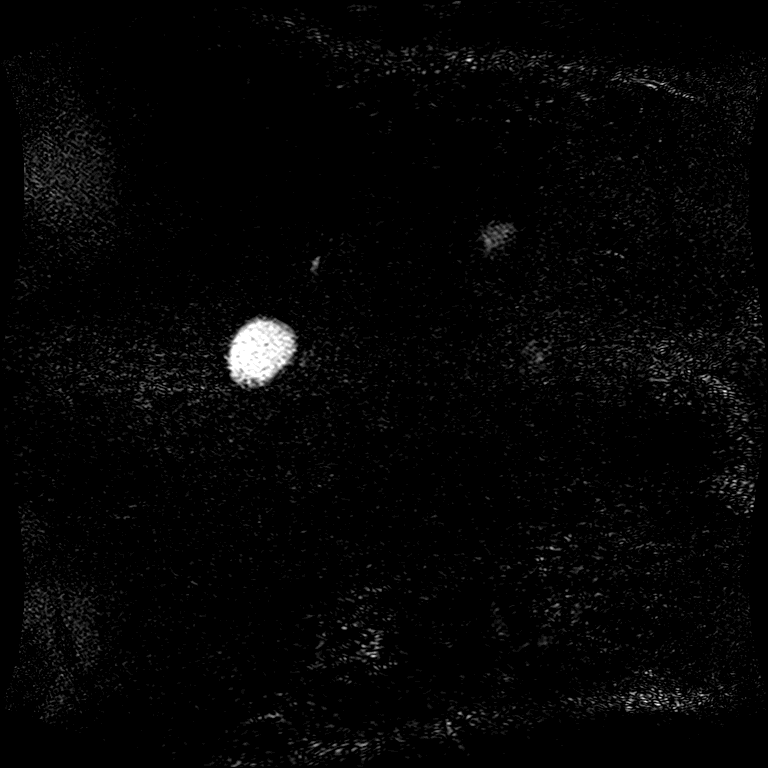
[im 88/88]
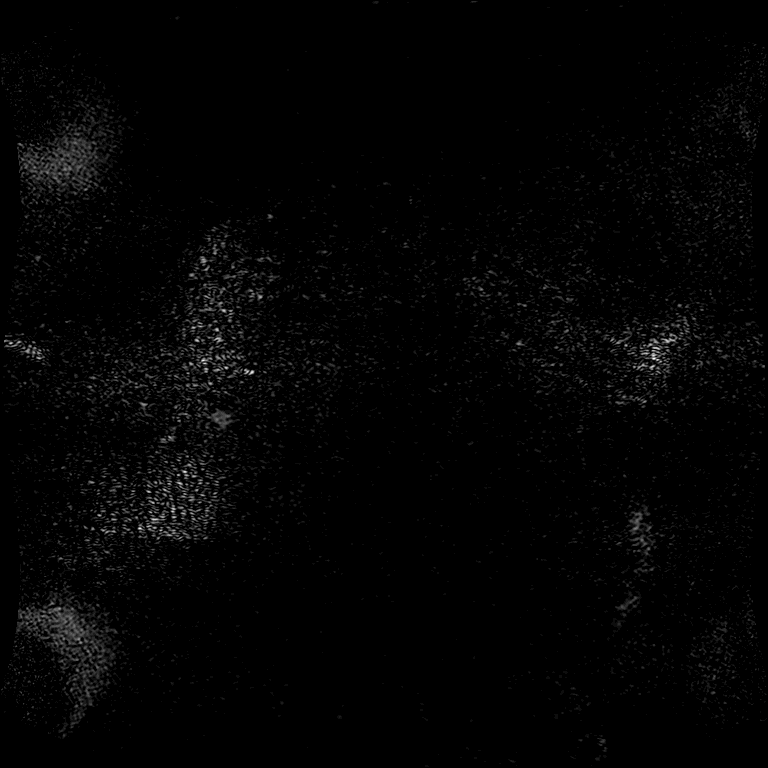

[Series 9: T2 · coronal · 6.0mm · 1.68mm/px · 1 of 34 slices shown (1 of 2)]
[im 1/34]
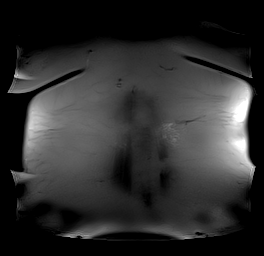

[Series 11: DWI · axial · 6.0mm · 1.64mm/px · z∈[-108,+194]mm · 3 of 86 slices shown (1 of 2)]
[im 1/86]
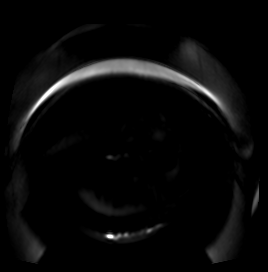
[im 43/86]
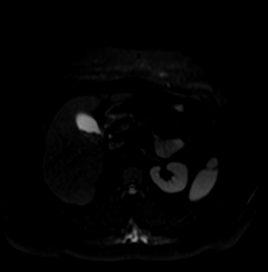
[im 86/86]
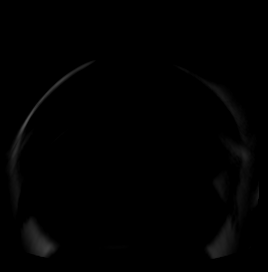

[Series 12: DWI · axial · 6.0mm · 1.64mm/px · 1 of 43 slices shown (2 of 2)]
[im 1/43]
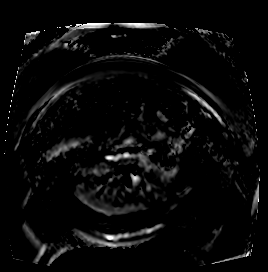

[Series 13: T1 · axial · 4.0mm · 1.38mm/px · z∈[-84,+200]mm · 2 of 72 slices shown (1 of 2)]
[im 1/72]
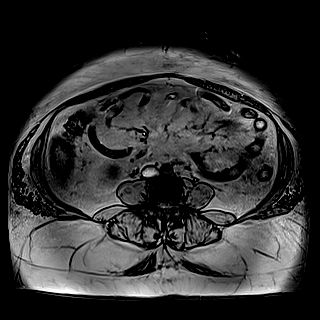
[im 72/72]
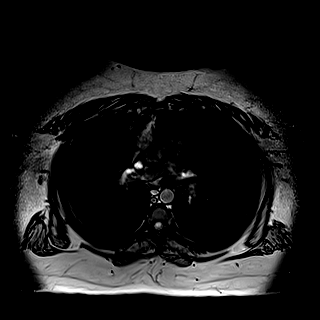

[Series 14: T1 · axial · 4.0mm · 1.38mm/px · z∈[-84,+200]mm · 2 of 72 slices shown (2 of 2)]
[im 1/72]
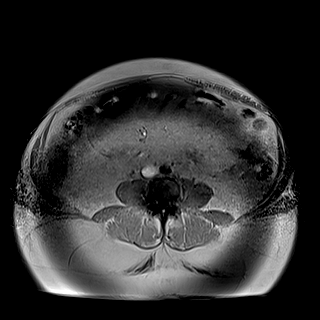
[im 72/72]
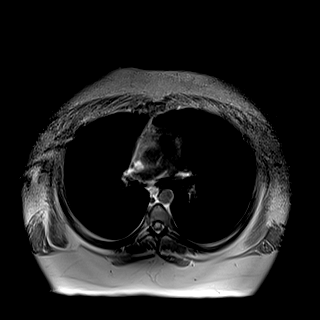

[Series 15: cor obl thk · sagittal · 50.0mm · 0.78mm/px · 1 of 9 slices shown]
[im 1/9]
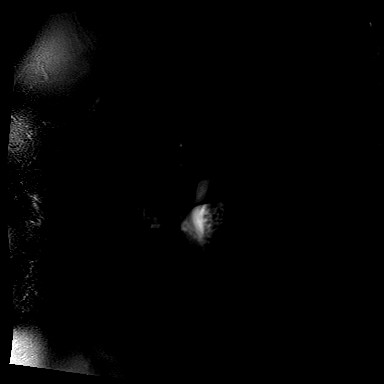

[Series 16: T2 · axial · 6.0mm · 1.72mm/px · 1 of 42 slices shown (2 of 2)]
[im 1/42]
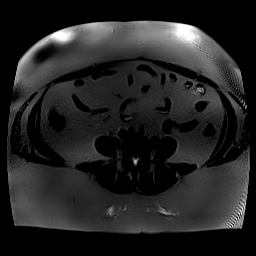

[Series 18: T1 dynamic · axial · 4.0mm · 1.38mm/px · z∈[-113,+203]mm · 3 of 80 slices shown (1 of 7)]
[im 1/80]
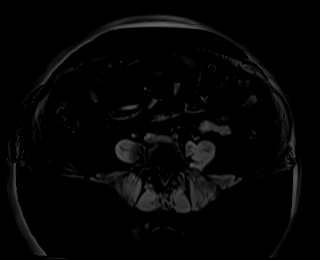
[im 40/80]
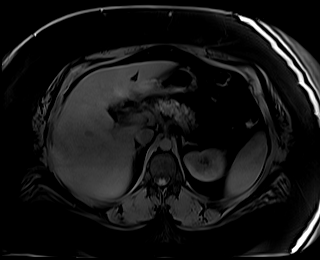
[im 80/80]
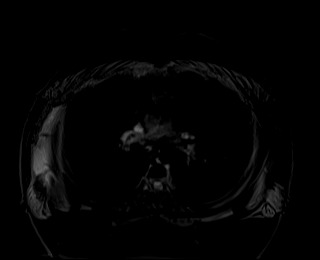

[Series 21: T1 dynamic · axial · 4.0mm · 1.38mm/px · z∈[-113,+203]mm · 3 of 80 slices shown (2 of 7)]
[im 1/80]
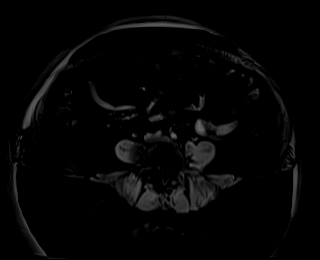
[im 40/80]
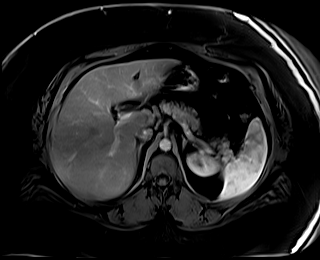
[im 80/80]
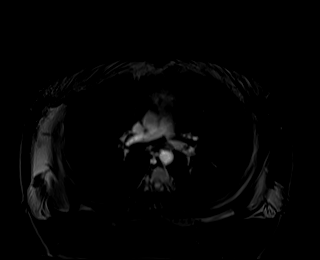

[Series 23: T1 dynamic · axial · 4.0mm · 1.38mm/px · z∈[-113,+203]mm · 3 of 80 slices shown (3 of 7)]
[im 1/80]
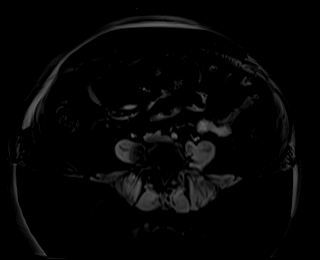
[im 40/80]
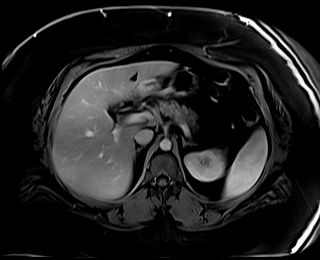
[im 80/80]
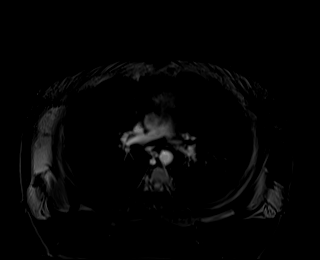

[Series 25: T1 dynamic · axial · 4.0mm · 1.38mm/px · z∈[-113,+203]mm · 3 of 80 slices shown (4 of 7)]
[im 1/80]
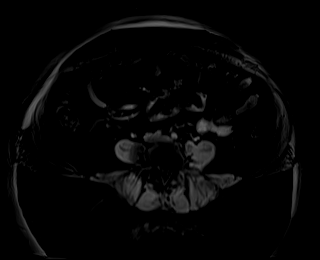
[im 40/80]
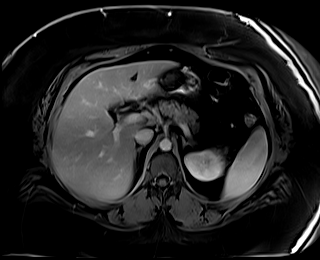
[im 80/80]
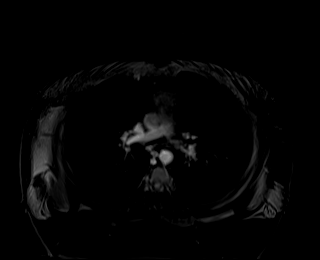

[Series 27: T1 dynamic · coronal · 3.0mm · 1.41mm/px · 2 of 72 slices shown (5 of 7)]
[im 1/72]
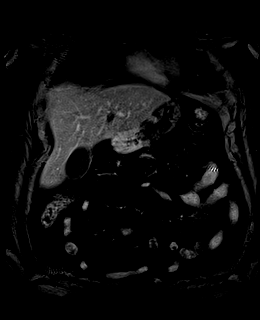
[im 72/72]
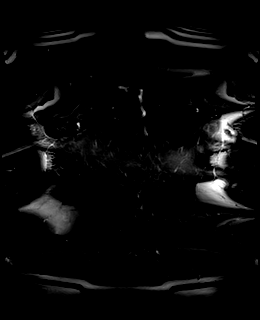

[Series 29: T1 dynamic · axial · 4.0mm · 1.38mm/px · z∈[-113,+203]mm · 3 of 80 slices shown (6 of 7)]
[im 1/80]
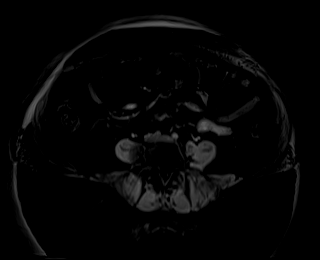
[im 40/80]
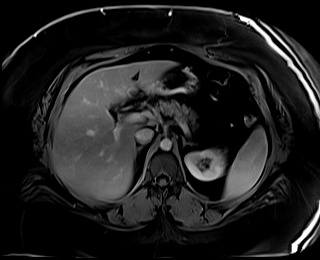
[im 80/80]
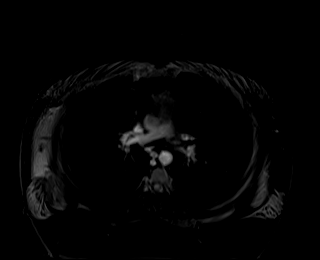

[Series 31: T1 dynamic · coronal · 5.0mm · 1.41mm/px · 2 of 60 slices shown (7 of 7)]
[im 1/60]
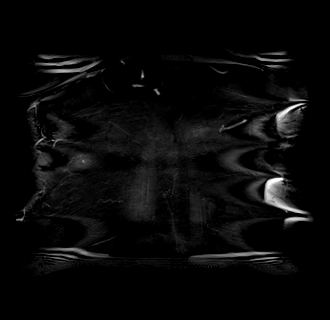
[im 60/60]
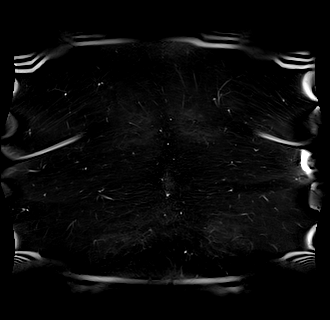

[Series 101: sub 20 sec · axial · 4.0mm · 1.38mm/px · 1 of 80 slices shown]
[im 1/80]
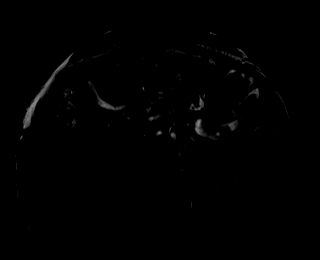

[35 of 48 positions shown; findings below may reference images not displayed]

FINDINGS: Lower chest: Unremarkable.

Hepatobiliary: Liver is enlarged measuring up to 23.2 cm in
craniocaudal span. Severe diffuse loss of signal intensity
throughout the hepatic parenchyma on out of phase dual echo images,
indicative of a background of severe hepatic steatosis. No
suspicious cystic or solid hepatic lesions. No intra or extrahepatic
biliary ductal dilatation noted on MRCP images. Common bile duct
measures up to 5 mm in the porta hepatis. No filling defect within
the common bile duct to suggest choledocholithiasis. Multiple
filling defects are noted within the gallbladder lying dependently,
compatible with small gallstones. Gallbladder is not distended.
Gallbladder wall thickness is normal. No pericholecystic fluid or
surrounding inflammatory changes.

Pancreas: No pancreatic mass. No pancreatic ductal dilatation noted
on MRCP images. No pancreatic or peripancreatic fluid collections or
inflammatory changes.

Spleen:  Unremarkable.

Adrenals/Urinary Tract: Subcentimeter T1 hypointense, T2
hyperintense, nonenhancing lesion in the lower pole of the left
kidney, compatible with a simple cyst. Right kidney and bilateral
adrenal glands are normal in appearance. No hydroureteronephrosis in
the visualized portions of the abdomen.

Stomach/Bowel: Visualized portions are unremarkable.

Vascular/Lymphatic: No aneurysm. No lymphadenopathy identified in
the visualized abdominal vasculature noted in the visualized
abdomen.

Other: No significant volume of ascites noted in the visualized
portions of the peritoneal cavity.

Musculoskeletal: No aggressive appearing osseous lesions noted in
the visualized portions of the skeleton.
IMPRESSION: 1. Study is positive for cholelithiasis. However, there is no
evidence of acute cholecystitis. Additionally, there is no evidence
of choledocholithiasis or biliary tract obstruction.
2. Hepatomegaly with severe hepatic steatosis.
